# Patient Record
Sex: Female | Born: 1965 | Race: White | Hispanic: No | Marital: Married | Smoking: Never smoker
Health system: Southern US, Community
[De-identification: ages and names within clinical notes are randomized; demographics above are authoritative.]

## PROBLEM LIST (undated history)

## (undated) DIAGNOSIS — E785 Hyperlipidemia, unspecified: Secondary | ICD-10-CM

## (undated) DIAGNOSIS — N63 Unspecified lump in unspecified breast: Secondary | ICD-10-CM

## (undated) HISTORY — PX: APPENDECTOMY: SHX54

## (undated) HISTORY — PX: TONSILLECTOMY: SUR1361

## (undated) HISTORY — DX: Hyperlipidemia, unspecified: E78.5

## (undated) HISTORY — PX: BUNIONECTOMY: SHX129

---

## 1998-04-30 ENCOUNTER — Other Ambulatory Visit: Admission: RE | Admit: 1998-04-30 | Discharge: 1998-04-30 | Payer: Self-pay | Admitting: *Deleted

## 1999-04-22 ENCOUNTER — Other Ambulatory Visit: Admission: RE | Admit: 1999-04-22 | Discharge: 1999-04-22 | Payer: Self-pay | Admitting: *Deleted

## 2000-05-16 ENCOUNTER — Other Ambulatory Visit: Admission: RE | Admit: 2000-05-16 | Discharge: 2000-05-16 | Payer: Self-pay | Admitting: *Deleted

## 2002-12-27 ENCOUNTER — Other Ambulatory Visit: Admission: RE | Admit: 2002-12-27 | Discharge: 2002-12-27 | Payer: Self-pay | Admitting: Obstetrics and Gynecology

## 2007-02-02 ENCOUNTER — Encounter: Admission: RE | Admit: 2007-02-02 | Discharge: 2007-02-02 | Payer: Self-pay | Admitting: Obstetrics & Gynecology

## 2008-02-06 ENCOUNTER — Encounter: Admission: RE | Admit: 2008-02-06 | Discharge: 2008-02-06 | Payer: Self-pay | Admitting: Obstetrics & Gynecology

## 2011-02-08 ENCOUNTER — Other Ambulatory Visit (HOSPITAL_COMMUNITY)
Admission: RE | Admit: 2011-02-08 | Discharge: 2011-02-08 | Disposition: A | Discharge status: Home / Self Care | Payer: BC Managed Care – PPO | Source: Ambulatory Visit | Attending: Family Medicine | Admitting: Family Medicine

## 2011-02-08 ENCOUNTER — Other Ambulatory Visit: Payer: Self-pay | Admitting: Family Medicine

## 2011-02-08 DIAGNOSIS — Z1159 Encounter for screening for other viral diseases: Secondary | ICD-10-CM | POA: Insufficient documentation

## 2011-02-08 DIAGNOSIS — Z124 Encounter for screening for malignant neoplasm of cervix: Secondary | ICD-10-CM | POA: Insufficient documentation

## 2015-02-18 ENCOUNTER — Other Ambulatory Visit: Payer: Self-pay | Admitting: Obstetrics & Gynecology

## 2015-02-18 DIAGNOSIS — R928 Other abnormal and inconclusive findings on diagnostic imaging of breast: Secondary | ICD-10-CM

## 2015-02-21 ENCOUNTER — Other Ambulatory Visit: Payer: Self-pay | Admitting: Obstetrics & Gynecology

## 2015-02-21 ENCOUNTER — Ambulatory Visit
Admission: RE | Admit: 2015-02-21 | Discharge: 2015-02-21 | Disposition: A | Discharge status: Home / Self Care | Payer: Managed Care, Other (non HMO) | Source: Ambulatory Visit | Attending: Obstetrics & Gynecology | Admitting: Obstetrics & Gynecology

## 2015-02-21 DIAGNOSIS — R928 Other abnormal and inconclusive findings on diagnostic imaging of breast: Secondary | ICD-10-CM

## 2015-02-25 ENCOUNTER — Other Ambulatory Visit: Payer: Self-pay | Admitting: Obstetrics & Gynecology

## 2015-02-25 DIAGNOSIS — R928 Other abnormal and inconclusive findings on diagnostic imaging of breast: Secondary | ICD-10-CM

## 2015-02-28 ENCOUNTER — Ambulatory Visit
Admission: RE | Admit: 2015-02-28 | Discharge: 2015-02-28 | Disposition: A | Discharge status: Home / Self Care | Payer: Managed Care, Other (non HMO) | Source: Ambulatory Visit | Attending: Obstetrics & Gynecology | Admitting: Obstetrics & Gynecology

## 2015-02-28 DIAGNOSIS — R928 Other abnormal and inconclusive findings on diagnostic imaging of breast: Secondary | ICD-10-CM

## 2015-03-24 ENCOUNTER — Other Ambulatory Visit: Payer: Self-pay | Admitting: General Surgery

## 2015-03-24 DIAGNOSIS — N6310 Unspecified lump in the right breast, unspecified quadrant: Secondary | ICD-10-CM

## 2015-04-01 ENCOUNTER — Encounter (HOSPITAL_BASED_OUTPATIENT_CLINIC_OR_DEPARTMENT_OTHER): Payer: Self-pay | Admitting: *Deleted

## 2015-04-01 ENCOUNTER — Other Ambulatory Visit: Payer: Self-pay | Admitting: General Surgery

## 2015-04-01 ENCOUNTER — Other Ambulatory Visit: Payer: Self-pay

## 2015-04-01 DIAGNOSIS — N6310 Unspecified lump in the right breast, unspecified quadrant: Secondary | ICD-10-CM

## 2015-04-04 ENCOUNTER — Ambulatory Visit
Admission: RE | Admit: 2015-04-04 | Discharge: 2015-04-04 | Disposition: A | Discharge status: Home / Self Care | Payer: Managed Care, Other (non HMO) | Source: Ambulatory Visit | Attending: General Surgery | Admitting: General Surgery

## 2015-04-04 DIAGNOSIS — N6310 Unspecified lump in the right breast, unspecified quadrant: Secondary | ICD-10-CM

## 2015-04-05 NOTE — H&P (Signed)
Candice Newton  Location: [facility] Patient #: [id number] DOB: 03/14/1966 Married / Language: English / Race: White Female      History of Present Illness  The patient is a 50 year old female who presents with a breast mass. This is a pleasant 50 year old Caucasian female, referred by Dr. Jetta Lout at the breast center [facility] for evaluation and management of a right breast mass in the upper outer quadrant. Dr. Harlene Ramus is her PCP. Dr. Lonia Skinner is her gynecologist.  The patient states she has felt a small irregular lump in her right breast upper outer quadrant for a couple of years. She thinks has gotten bigger. She has never had a prior breast problems. Recent mammograms and ultrasound show a 1.6 cm irregular solid mass in the right breast at the 11:30 position, 4 cm from the nipple. There is also a 1.7 cm cyst in the right breast at the 1:30 position which looks benign. Image guided biopsy shows a complex sclerosing lesion. Differential diagnosis includes papilloma, hamartoma, radial scar. She was told by the images to have this area excised. She is in favor of that. Her husband is with her today.  Past history is fairly negative. She is healthy. No other complaints about her breast. No trauma. No nipple discharge. No skin changes.  Family history reveals that her mother died at age 4 of lung cancer. They thought this was metastatic. No surgical primary was searched for it due to advanced age.. No ovarian or pancreatic cancer in the family  After examining her I had a long talk with her and her husband. We discussed the differential diagnosis. We discussed the imaging findings of the pathology. I told her that at most, there was a 10% risk of in situ cancer. Most likely this is benign but there may be a risk factors involved and she very much in favor of having this area conservatively excised. She will be scheduled for right breast lumpectomy with  radioactive seed localization. I discussed the indications, details, techniques, and numerous risk of the surgery with her. She is aware of the risk of bleeding, infection, reoperation if this is cancer, nerve damage with chronic pain, cosmetic deformity, and other unforeseen problems. She understands these issues well. At this time all questions were answered. She agrees with this plan.   Other Problems  Hypercholesterolemia  Past Surgical History  Appendectomy Breast Biopsy Right. Foot Surgery Left. Oral Surgery Tonsillectomy  Diagnostic Studies History  Mammogram within last year  Allergies Penicillin V *PENICILLINS* Hives.  Medication History  Vitamin D (Ergocalciferol) (50000UNIT Capsule, Oral) Active. Medications Reconciled Allegra (30MG  Tablet, Oral) Active.  Social History  Alcohol use Moderate alcohol use. No drug use Tobacco use Never smoker.  Family History  Diabetes Mellitus Brother. Respiratory Condition Father, Mother. Thyroid problems Brother, Mother.  Pregnancy / Birth History  Contraceptive History Intrauterine device. Gravida 3 Irregular periods Maternal age 18-25 Para 3    Review of Systems  General Not Present- Appetite Loss, Chills, Fatigue, Fever, Night Sweats, Weight Gain and Weight Loss. Skin Not Present- Change in Wart/Mole, Dryness, Hives, Jaundice, New Lesions, Non-Healing Wounds, Rash and Ulcer. HEENT Present- Seasonal Allergies. Not Present- Earache, Hearing Loss, Hoarseness, Nose Bleed, Oral Ulcers, Ringing in the Ears, Sinus Pain, Sore Throat, Visual Disturbances, Wears glasses/contact lenses and Yellow Eyes. Respiratory Not Present- Bloody sputum, Chronic Cough, Difficulty Breathing, Snoring and Wheezing. Breast Present- Breast Mass. Not Present- Breast Pain, Nipple Discharge and Skin Changes. Cardiovascular Not Present- Chest  Pain, Difficulty Breathing Lying Down, Leg Cramps, Palpitations, Rapid Heart Rate,  Shortness of Breath and Swelling of Extremities. Gastrointestinal Present- Indigestion. Not Present- Abdominal Pain, Bloating, Bloody Stool, Change in Bowel Habits, Chronic diarrhea, Constipation, Difficulty Swallowing, Excessive gas, Gets full quickly at meals, Hemorrhoids, Nausea, Rectal Pain and Vomiting. Female Genitourinary Not Present- Frequency, Nocturia, Painful Urination, Pelvic Pain and Urgency. Musculoskeletal Present- Back Pain. Not Present- Joint Pain, Joint Stiffness, Muscle Pain, Muscle Weakness and Swelling of Extremities. Neurological Present- Numbness. Not Present- Decreased Memory, Fainting, Headaches, Seizures, Tingling, Tremor, Trouble walking and Weakness. Psychiatric Not Present- Anxiety, Bipolar, Change in Sleep Pattern, Depression, Fearful and Frequent crying. Endocrine Present- Hot flashes. Not Present- Cold Intolerance, Excessive Hunger, Hair Changes, Heat Intolerance and New Diabetes. Hematology Not Present- Easy Bruising, Excessive bleeding, Gland problems, HIV and Persistent Infections.  Vitals  Weight: 167 lb Height: 66in Body Surface Area: 1.85 m Body Mass Index: 26.95 kg/m  Temp.: 28F(Temporal)  Pulse: 100 (Regular)  BP: 142/80 (Sitting, Left Arm, Standard)       Physical Exam General Mental Status-Alert. General Appearance-Not in acute distress. Build & Nutrition-Well nourished. Posture-Normal posture. Gait-Normal.  Head and Neck Head-normocephalic, atraumatic with no lesions or palpable masses. Trachea-midline. Thyroid Gland Characteristics - normal size and consistency and no palpable nodules.  Chest and Lung Exam Chest and lung exam reveals -on auscultation, normal breath sounds, no adventitious sounds and normal vocal resonance.  Breast Note: Breasts are large. Skin is healthy. Nipple and areola complexes looked normal. No axillary adenopathy. At the 11 o'clock position of the right breast. About 4 cm  peripheral to the nipple at most, there is a 1.5 cm irregular palpable mass. A little bit vague but reproducible. No other masses in either breast. Nontender.   Cardiovascular Cardiovascular examination reveals -normal heart sounds, regular rate and rhythm with no murmurs and femoral artery auscultation bilaterally reveals normal pulses, no bruits, no thrills.  Abdomen Inspection Inspection of the abdomen reveals - No Hernias. Palpation/Percussion Palpation and Percussion of the abdomen reveal - Soft, Non Tender, No Rigidity (guarding), No hepatosplenomegaly and No Palpable abdominal masses.  Neurologic Neurologic evaluation reveals -alert and oriented x 3 with no impairment of recent or remote memory, normal attention span and ability to concentrate, normal sensation and normal coordination.  Musculoskeletal Normal Exam - Bilateral-Upper Extremity Strength Normal and Lower Extremity Strength Normal.    Assessment & Plan  BREAST MASS, RIGHT (N63) .   Your recent imaging studies and biopsy show a complex sclerosing lesion of the right breast, upper outer quadrant, 1.6 cm diameter. This is most likely a benign proliferative lesion such as a fibroadenoma or a papilloma but there is a small chance that there could be an in situ cancer. We have discussed the possibilities and probabilities. We have decided to proceed with right breast lumpectomy with radioactive seed localization. I have discussed the indications, technique, and risks of this surgery in detail. Please read the written information that we have printed out for you.    Edsel Petrin. Dalbert Batman, M.D., [facility] General and Minimally invasive Surgery Breast and Colorectal Surgery Office:   [phone] Pager:   [phone]

## 2015-04-07 ENCOUNTER — Encounter (HOSPITAL_BASED_OUTPATIENT_CLINIC_OR_DEPARTMENT_OTHER)
Admission: RE | Disposition: A | Discharge status: Home / Self Care | Payer: Self-pay | Source: Ambulatory Visit | Attending: General Surgery

## 2015-04-07 ENCOUNTER — Ambulatory Visit (HOSPITAL_BASED_OUTPATIENT_CLINIC_OR_DEPARTMENT_OTHER)
Admission: RE | Admit: 2015-04-07 | Discharge: 2015-04-07 | Disposition: A | Discharge status: Home / Self Care | Payer: Managed Care, Other (non HMO) | Source: Ambulatory Visit | Attending: General Surgery | Admitting: General Surgery

## 2015-04-07 ENCOUNTER — Ambulatory Visit (HOSPITAL_BASED_OUTPATIENT_CLINIC_OR_DEPARTMENT_OTHER): Payer: Managed Care, Other (non HMO) | Admitting: Certified Registered"

## 2015-04-07 ENCOUNTER — Encounter (HOSPITAL_BASED_OUTPATIENT_CLINIC_OR_DEPARTMENT_OTHER): Payer: Self-pay | Admitting: Certified Registered"

## 2015-04-07 ENCOUNTER — Ambulatory Visit
Admission: RE | Admit: 2015-04-07 | Discharge: 2015-04-07 | Disposition: A | Discharge status: Home / Self Care | Payer: Managed Care, Other (non HMO) | Source: Ambulatory Visit | Attending: General Surgery | Admitting: General Surgery

## 2015-04-07 DIAGNOSIS — E7800 Pure hypercholesterolemia, unspecified: Secondary | ICD-10-CM | POA: Insufficient documentation

## 2015-04-07 DIAGNOSIS — Z9889 Other specified postprocedural states: Secondary | ICD-10-CM

## 2015-04-07 DIAGNOSIS — N6021 Fibroadenosis of right breast: Secondary | ICD-10-CM | POA: Insufficient documentation

## 2015-04-07 DIAGNOSIS — N6310 Unspecified lump in the right breast, unspecified quadrant: Secondary | ICD-10-CM

## 2015-04-07 DIAGNOSIS — D241 Benign neoplasm of right breast: Secondary | ICD-10-CM | POA: Diagnosis not present

## 2015-04-07 DIAGNOSIS — N63 Unspecified lump in breast: Secondary | ICD-10-CM | POA: Diagnosis present

## 2015-04-07 DIAGNOSIS — Z801 Family history of malignant neoplasm of trachea, bronchus and lung: Secondary | ICD-10-CM | POA: Diagnosis not present

## 2015-04-07 HISTORY — DX: Other specified postprocedural states: Z98.890

## 2015-04-07 HISTORY — DX: Unspecified lump in unspecified breast: N63.0

## 2015-04-07 HISTORY — PX: BREAST LUMPECTOMY WITH RADIOACTIVE SEED LOCALIZATION: SHX6424

## 2015-04-07 SURGERY — BREAST LUMPECTOMY WITH RADIOACTIVE SEED LOCALIZATION
Anesthesia: General | Site: Breast | Laterality: Right

## 2015-04-07 MED ORDER — BUPIVACAINE-EPINEPHRINE (PF) 0.5% -1:200000 IJ SOLN
INTRAMUSCULAR | Status: DC | PRN
  Administered 2015-04-07: 10 mL via PERINEURAL

## 2015-04-07 MED ORDER — SODIUM CHLORIDE 0.9 % IJ SOLN: 3.0000 mL | INTRAMUSCULAR | Status: DC | PRN

## 2015-04-07 MED ORDER — ONDANSETRON HCL 4 MG/2ML IJ SOLN
INTRAMUSCULAR | Status: AC
  Filled 2015-04-07: qty 2

## 2015-04-07 MED ORDER — ONDANSETRON HCL 4 MG/2ML IJ SOLN
INTRAMUSCULAR | Status: DC | PRN
  Administered 2015-04-07: 4 mg via INTRAVENOUS

## 2015-04-07 MED ORDER — MIDAZOLAM HCL 2 MG/2ML IJ SOLN
1.0000 mg | INTRAMUSCULAR | Status: DC | PRN
  Administered 2015-04-07: 2 mg via INTRAVENOUS

## 2015-04-07 MED ORDER — SODIUM CHLORIDE 0.9 % IV SOLN: INTRAVENOUS | Status: DC

## 2015-04-07 MED ORDER — PROPOFOL 500 MG/50ML IV EMUL
INTRAVENOUS | Status: AC
  Filled 2015-04-07: qty 50

## 2015-04-07 MED ORDER — MIDAZOLAM HCL 2 MG/2ML IJ SOLN
INTRAMUSCULAR | Status: AC
  Filled 2015-04-07: qty 2

## 2015-04-07 MED ORDER — LIDOCAINE HCL (CARDIAC) 20 MG/ML IV SOLN
INTRAVENOUS | Status: DC | PRN
  Administered 2015-04-07: 60 mg via INTRAVENOUS

## 2015-04-07 MED ORDER — CEFAZOLIN SODIUM-DEXTROSE 2-3 GM-% IV SOLR
INTRAVENOUS | Status: AC
  Filled 2015-04-07: qty 50

## 2015-04-07 MED ORDER — CHLORHEXIDINE GLUCONATE 4 % EX LIQD: 1.0000 "application " | Freq: Once | CUTANEOUS | Status: DC

## 2015-04-07 MED ORDER — SODIUM CHLORIDE 0.9 % IV SOLN: 250.0000 mL | INTRAVENOUS | Status: DC | PRN

## 2015-04-07 MED ORDER — ACETAMINOPHEN 325 MG PO TABS: 650.0000 mg | ORAL_TABLET | ORAL | Status: DC | PRN

## 2015-04-07 MED ORDER — SCOPOLAMINE 1 MG/3DAYS TD PT72: 1.0000 | MEDICATED_PATCH | Freq: Once | TRANSDERMAL | Status: DC

## 2015-04-07 MED ORDER — LACTATED RINGERS IV SOLN
INTRAVENOUS | Status: DC
  Administered 2015-04-07 (×2): via INTRAVENOUS

## 2015-04-07 MED ORDER — MEPERIDINE HCL 25 MG/ML IJ SOLN: 6.2500 mg | INTRAMUSCULAR | Status: DC | PRN

## 2015-04-07 MED ORDER — OXYCODONE HCL 5 MG PO TABS: 5.0000 mg | ORAL_TABLET | Freq: Once | ORAL | Status: DC | PRN

## 2015-04-07 MED ORDER — DEXAMETHASONE SODIUM PHOSPHATE 4 MG/ML IJ SOLN
INTRAMUSCULAR | Status: DC | PRN
  Administered 2015-04-07: 10 mg via INTRAVENOUS

## 2015-04-07 MED ORDER — OXYCODONE HCL 5 MG/5ML PO SOLN: 5.0000 mg | Freq: Once | ORAL | Status: DC | PRN

## 2015-04-07 MED ORDER — DEXAMETHASONE SODIUM PHOSPHATE 10 MG/ML IJ SOLN
INTRAMUSCULAR | Status: AC
  Filled 2015-04-07: qty 1

## 2015-04-07 MED ORDER — LIDOCAINE HCL (CARDIAC) 20 MG/ML IV SOLN
INTRAVENOUS | Status: AC
  Filled 2015-04-07: qty 5

## 2015-04-07 MED ORDER — FENTANYL CITRATE (PF) 100 MCG/2ML IJ SOLN
INTRAMUSCULAR | Status: AC
  Filled 2015-04-07: qty 2

## 2015-04-07 MED ORDER — VANCOMYCIN HCL IN DEXTROSE 1-5 GM/200ML-% IV SOLN
INTRAVENOUS | Status: AC
  Filled 2015-04-07: qty 200

## 2015-04-07 MED ORDER — ACETAMINOPHEN 650 MG RE SUPP: 650.0000 mg | RECTAL | Status: DC | PRN

## 2015-04-07 MED ORDER — HYDROMORPHONE HCL 1 MG/ML IJ SOLN: 0.2500 mg | INTRAMUSCULAR | Status: DC | PRN

## 2015-04-07 MED ORDER — BUPIVACAINE-EPINEPHRINE (PF) 0.5% -1:200000 IJ SOLN
INTRAMUSCULAR | Status: AC
Start: 2015-04-07 — End: 2015-04-07
  Filled 2015-04-07: qty 30

## 2015-04-07 MED ORDER — FENTANYL CITRATE (PF) 100 MCG/2ML IJ SOLN
25.0000 ug | INTRAMUSCULAR | Status: DC | PRN
  Administered 2015-04-07: 50 ug via INTRAVENOUS

## 2015-04-07 MED ORDER — PROPOFOL 10 MG/ML IV BOLUS
INTRAVENOUS | Status: DC | PRN
  Administered 2015-04-07: 150 mg via INTRAVENOUS

## 2015-04-07 MED ORDER — SODIUM CHLORIDE 0.9 % IJ SOLN: 3.0000 mL | Freq: Two times a day (BID) | INTRAMUSCULAR | Status: DC

## 2015-04-07 MED ORDER — FENTANYL CITRATE (PF) 100 MCG/2ML IJ SOLN
50.0000 ug | INTRAMUSCULAR | Status: AC | PRN
  Administered 2015-04-07 (×3): 50 ug via INTRAVENOUS

## 2015-04-07 MED ORDER — CEFAZOLIN SODIUM-DEXTROSE 2-3 GM-% IV SOLR
2.0000 g | INTRAVENOUS | Status: AC
  Administered 2015-04-07: 2 g via INTRAVENOUS

## 2015-04-07 MED ORDER — OXYCODONE HCL 5 MG PO TABS
ORAL_TABLET | ORAL | Status: AC
  Filled 2015-04-07: qty 1

## 2015-04-07 MED ORDER — OXYCODONE HCL 5 MG PO TABS
5.0000 mg | ORAL_TABLET | ORAL | Status: DC | PRN
  Administered 2015-04-07: 5 mg via ORAL

## 2015-04-07 MED ORDER — GLYCOPYRROLATE 0.2 MG/ML IJ SOLN
0.2000 mg | Freq: Once | INTRAMUSCULAR | Status: DC | PRN
Start: 2015-04-07 — End: 2015-04-07

## 2015-04-07 MED ORDER — HYDROCODONE-ACETAMINOPHEN 5-325 MG PO TABS: 1.0000 | ORAL_TABLET | Freq: Four times a day (QID) | ORAL | Status: DC | PRN

## 2015-04-07 SURGICAL SUPPLY — 64 items
ADH SKN CLS APL DERMABOND .7 (GAUZE/BANDAGES/DRESSINGS) ×1
APL SKNCLS STERI-STRIP NONHPOA (GAUZE/BANDAGES/DRESSINGS)
APPLIER CLIP 9.375 MED OPEN (MISCELLANEOUS)
APR CLP MED 9.3 20 MLT OPN (MISCELLANEOUS)
BENZOIN TINCTURE PRP APPL 2/3 (GAUZE/BANDAGES/DRESSINGS) IMPLANT
BINDER BREAST LRG (GAUZE/BANDAGES/DRESSINGS) ×2 IMPLANT
BINDER BREAST MEDIUM (GAUZE/BANDAGES/DRESSINGS) IMPLANT
BINDER BREAST XLRG (GAUZE/BANDAGES/DRESSINGS) IMPLANT
BINDER BREAST XXLRG (GAUZE/BANDAGES/DRESSINGS) IMPLANT
BLADE HEX COATED 2.75 (ELECTRODE) ×3 IMPLANT
BLADE SURG 10 STRL SS (BLADE) IMPLANT
BLADE SURG 15 STRL LF DISP TIS (BLADE) ×1 IMPLANT
BLADE SURG 15 STRL SS (BLADE) ×3
CANISTER SUC SOCK COL 7IN (MISCELLANEOUS) IMPLANT
CANISTER SUCT 1200ML W/VALVE (MISCELLANEOUS) ×3 IMPLANT
CHLORAPREP W/TINT 26ML (MISCELLANEOUS) ×3 IMPLANT
CLIP APPLIE 9.375 MED OPEN (MISCELLANEOUS) IMPLANT
CLOSURE WOUND 1/2 X4 (GAUZE/BANDAGES/DRESSINGS)
COVER BACK TABLE 60X90IN (DRAPES) ×3 IMPLANT
COVER MAYO STAND STRL (DRAPES) ×3 IMPLANT
COVER PROBE W GEL 5X96 (DRAPES) ×3 IMPLANT
DECANTER SPIKE VIAL GLASS SM (MISCELLANEOUS) IMPLANT
DERMABOND ADVANCED (GAUZE/BANDAGES/DRESSINGS) ×2
DERMABOND ADVANCED .7 DNX12 (GAUZE/BANDAGES/DRESSINGS) ×1 IMPLANT
DEVICE DUBIN W/COMP PLATE 8390 (MISCELLANEOUS) ×3 IMPLANT
DRAPE LAPAROSCOPIC ABDOMINAL (DRAPES) ×3 IMPLANT
DRAPE UTILITY XL STRL (DRAPES) ×3 IMPLANT
DRSG PAD ABDOMINAL 8X10 ST (GAUZE/BANDAGES/DRESSINGS) IMPLANT
ELECT REM PT RETURN 9FT ADLT (ELECTROSURGICAL) ×3
ELECTRODE REM PT RTRN 9FT ADLT (ELECTROSURGICAL) ×1 IMPLANT
GLOVE BIO SURGEON STRL SZ7 (GLOVE) ×2 IMPLANT
GLOVE BIOGEL PI IND STRL 7.5 (GLOVE) IMPLANT
GLOVE BIOGEL PI INDICATOR 7.5 (GLOVE) ×4
GLOVE EUDERMIC 7 POWDERFREE (GLOVE) ×3 IMPLANT
GOWN STRL REUS W/ TWL LRG LVL3 (GOWN DISPOSABLE) ×1 IMPLANT
GOWN STRL REUS W/ TWL XL LVL3 (GOWN DISPOSABLE) ×1 IMPLANT
GOWN STRL REUS W/TWL LRG LVL3 (GOWN DISPOSABLE) ×3
GOWN STRL REUS W/TWL XL LVL3 (GOWN DISPOSABLE) ×3
KIT MARKER MARGIN INK (KITS) ×3 IMPLANT
NDL HYPO 25X1 1.5 SAFETY (NEEDLE) ×1 IMPLANT
NEEDLE HYPO 25X1 1.5 SAFETY (NEEDLE) ×3 IMPLANT
NS IRRIG 1000ML POUR BTL (IV SOLUTION) ×3 IMPLANT
PACK BASIN DAY SURGERY FS (CUSTOM PROCEDURE TRAY) ×3 IMPLANT
PENCIL BUTTON HOLSTER BLD 10FT (ELECTRODE) ×3 IMPLANT
SHEET MEDIUM DRAPE 40X70 STRL (DRAPES) IMPLANT
SLEEVE SCD COMPRESS KNEE MED (MISCELLANEOUS) ×3 IMPLANT
SPONGE GAUZE 4X4 12PLY STER LF (GAUZE/BANDAGES/DRESSINGS) IMPLANT
SPONGE LAP 18X18 X RAY DECT (DISPOSABLE) IMPLANT
SPONGE LAP 4X18 X RAY DECT (DISPOSABLE) ×3 IMPLANT
STRIP CLOSURE SKIN 1/2X4 (GAUZE/BANDAGES/DRESSINGS) IMPLANT
SUT ETHILON 3 0 FSL (SUTURE) IMPLANT
SUT MNCRL AB 4-0 PS2 18 (SUTURE) ×3 IMPLANT
SUT SILK 2 0 SH (SUTURE) ×3 IMPLANT
SUT VIC AB 2-0 CT1 27 (SUTURE)
SUT VIC AB 2-0 CT1 TAPERPNT 27 (SUTURE) IMPLANT
SUT VIC AB 3-0 SH 27 (SUTURE)
SUT VIC AB 3-0 SH 27X BRD (SUTURE) IMPLANT
SUT VICRYL 3-0 CR8 SH (SUTURE) ×3 IMPLANT
SYRINGE 10CC LL (SYRINGE) ×3 IMPLANT
TOWEL OR 17X24 6PK STRL BLUE (TOWEL DISPOSABLE) ×3 IMPLANT
TOWEL OR NON WOVEN STRL DISP B (DISPOSABLE) ×2 IMPLANT
TUBE CONNECTING 20'X1/4 (TUBING) ×1
TUBE CONNECTING 20X1/4 (TUBING) ×2 IMPLANT
YANKAUER SUCT BULB TIP NO VENT (SUCTIONS) ×3 IMPLANT

## 2015-04-07 NOTE — Anesthesia Procedure Notes (Signed)
Procedure Name: LMA Insertion Date/Time: 04/07/2015 7:25 AM Performed by: Alorah Mcree D Pre-anesthesia Checklist: Patient identified, Emergency Drugs available, Suction available and Patient being monitored Patient Re-evaluated:Patient Re-evaluated prior to inductionOxygen Delivery Method: Circle System Utilized Preoxygenation: Pre-oxygenation with 100% oxygen Intubation Type: IV induction Ventilation: Mask ventilation without difficulty LMA: LMA inserted LMA Size: 4.0 Number of attempts: 1 Airway Equipment and Method: Bite block Placement Confirmation: positive ETCO2 Tube secured with: Tape Dental Injury: Teeth and Oropharynx as per pre-operative assessment

## 2015-04-07 NOTE — Interval H&P Note (Signed)
History and Physical Interval Note:  04/07/2015 7:16 AM  Candice Newton  has presented today for surgery, with the diagnosis of right breast mass  The various methods of treatment have been discussed with the patient and family. After consideration of risks, benefits and other options for treatment, the patient has consented to  Procedure(s): RIGHT BREAST LUMPECTOMY WITH RADIOACTIVE SEED LOCALIZATION (Right) as a surgical intervention .  The patient's history has been reviewed, patient examined, no change in status, stable for surgery.  I have reviewed the patient's chart and labs.  Questions were answered to the patient's satisfaction.     Adin Hector

## 2015-04-07 NOTE — Discharge Instructions (Signed)
[facility] °Office Phone Number [phone] ° °BREAST BIOPSY/ PARTIAL MASTECTOMY: POST OP INSTRUCTIONS ° °Always review your discharge instruction sheet given to you by the facility where your surgery was performed. ° °IF YOU HAVE DISABILITY OR FAMILY LEAVE FORMS, YOU MUST BRING THEM TO THE OFFICE FOR PROCESSING.  DO NOT GIVE THEM TO YOUR DOCTOR. ° °1. A prescription for pain medication may be given to you upon discharge.  Take your pain medication as prescribed, if needed.  If narcotic pain medicine is not needed, then you may take acetaminophen (Tylenol) or ibuprofen (Advil) as needed. °2. Take your usually prescribed medications unless otherwise directed °3. If you need a refill on your pain medication, please contact your pharmacy.  They will contact our office to request authorization.  Prescriptions will not be filled after 5pm or on week-ends. °4. You should eat very light the first 24 hours after surgery, such as soup, crackers, pudding, etc.  Resume your normal diet the day after surgery. °5. Most patients will experience some swelling and bruising in the breast.  Ice packs and a good support bra will help.  Swelling and bruising can take several days to resolve.  °6. It is common to experience some constipation if taking pain medication after surgery.  Increasing fluid intake and taking a stool softener will usually help or prevent this problem from occurring.  A mild laxative (Milk of Magnesia or Miralax) should be taken according to package directions if there are no bowel movements after 48 hours. °7. Unless discharge instructions indicate otherwise, you may remove your bandages 24-48 hours after surgery, and you may shower at that time.  You may have steri-strips (small skin tapes) in place directly over the incision.  These strips should be left on the skin for 7-10 days.  If your surgeon used skin glue on the incision, you may shower in 24 hours.  The glue will flake off over the  next 2-3 weeks.  Any sutures or staples will be removed at the office during your follow-up visit. °8. ACTIVITIES:  You may resume regular daily activities (gradually increasing) beginning the next day.  Wearing a good support bra or sports bra minimizes pain and swelling.  You may have sexual intercourse when it is comfortable. °a. You may drive when you no longer are taking prescription pain medication, you can comfortably wear a seatbelt, and you can safely maneuver your car and apply brakes. °b. RETURN TO WORK:  ______________________________________________________________________________________ °9. You should see your doctor in the office for a follow-up appointment approximately two weeks after your surgery.  Your doctor’s nurse will typically make your follow-up appointment when she calls you with your pathology report.  Expect your pathology report 2-3 business days after your surgery.  You may call to check if you do not hear from us after three days. °10. OTHER INSTRUCTIONS: _______________________________________________________________________________________________ _____________________________________________________________________________________________________________________________________ °_____________________________________________________________________________________________________________________________________ °_____________________________________________________________________________________________________________________________________ ° °WHEN TO CALL YOUR DOCTOR: °1. Fever over 101.0 °2. Nausea and/or vomiting. °3. Extreme swelling or bruising. °4. Continued bleeding from incision. °5. Increased pain, redness, or drainage from the incision. ° °The clinic staff is available to answer your questions during regular business hours.  Please don’t hesitate to call and ask to speak to one of the nurses for clinical concerns.  If you have a medical emergency, go to the nearest  emergency room or call 911.  A surgeon from [facility] is always on call at the hospital. ° °For further questions, please visit [email or web address]  ° ° ° °  Post Anesthesia Home Care Instructions ° °Activity: °Get plenty of rest for the remainder of the day. A responsible adult should stay with you for 24 hours following the procedure.  °For the next 24 hours, DO NOT: °-Drive a car °-Operate machinery °-Drink alcoholic beverages °-Take any medication unless instructed by your physician °-Make any legal decisions or sign important papers. ° °Meals: °Start with liquid foods such as gelatin or soup. Progress to regular foods as tolerated. Avoid greasy, spicy, heavy foods. If nausea and/or vomiting occur, drink only clear liquids until the nausea and/or vomiting subsides. Call your physician if vomiting continues. ° °Special Instructions/Symptoms: °Your throat may feel dry or sore from the anesthesia or the breathing tube placed in your throat during surgery. If this causes discomfort, gargle with warm salt water. The discomfort should disappear within 24 hours. ° °If you had a scopolamine patch placed behind your ear for the management of post- operative nausea and/or vomiting: ° °1. The medication in the patch is effective for 72 hours, after which it should be removed.  Wrap patch in a tissue and discard in the trash. Wash hands thoroughly with soap and water. °2. You may remove the patch earlier than 72 hours if you experience unpleasant side effects which may include dry mouth, dizziness or visual disturbances. °3. Avoid touching the patch. Wash your hands with soap and water after contact with the patch. °  ° °

## 2015-04-07 NOTE — Op Note (Signed)
Patient Name:           Candice Newton   Date of Surgery:        04/07/2015  Pre op Diagnosis:      Abnormal mammogram and right breast mass, upper outer quadrant  Post op Diagnosis:    Same  Procedure:                 Right breast lumpectomy with radioactive seed localization and margin assessment  Surgeon:                     Edsel Petrin. Dalbert Batman, M.D., FACS  Assistant:                      Or staff  Operative Indications:    This is a pleasant 50 year old Caucasian female, referred by Dr. Jetta Lout at the breast center [facility] for evaluation and management of a right breast mass in the upper outer quadrant. Dr. Harlene Ramus is her PCP. Dr. Lonia Skinner is her gynecologist.      The patient states she has felt a small irregular lump in her right breast upper outer quadrant for a couple of years. She thinks has gotten bigger. She has never had a prior breast problems. Recent mammograms and ultrasound show a 1.6 cm irregular solid mass in the right breast at the 11:30 position, 4 cm from the nipple. There is also a 1.7 cm cyst in the right breast at the 1:30 position which looks benign. Image guided biopsy shows a complex sclerosing lesion. Differential diagnosis includes papilloma, hamartoma, radial scar. She was told by the images to have this area excised. She is in favor of that.     Past history is fairly negative. She is healthy. No other complaints about her breast. No trauma. No nipple discharge. No skin changes.      Family history reveals that her mother died at age 35 of lung cancer. They thought this was metastatic. No surgical primary was searched for it due to advanced age.. No ovarian or pancreatic cancer in the family     She will be scheduled for right breast lumpectomy with radioactive seed localization.   Operative Findings:       The marker clip and radioactive seed were identified in the upper outer quadrant of the right breast both preoperatively with the  neoprobe and postoperatively with Faxitron image control.  The radiologist concurred that the area in question had been removed.  The radioactive seed was confirmed by the pathologist.  Procedure in Detail:          Following the induction of general LMA anesthesia the patient's right breast was prepped and draped in sterile fashion.  Intravenous antibiotics were given.  Surgical timeout was performed.  0.5% Marcaine with epinephrine was used as a local infiltration anesthetic.  Using the neoprobe I identified the area of maximum radioactive counts in the upper outer quadrant and performed  a curvilinear circumareolar incision in this position.  Dissection was carried down into the breast tissue.  Using the neoprobe as a guide a dissected around the area of radioactivity.  The specimen was marked with silk sutures and a 6 color ink kit to orient the pathologist.  Specimen mammogram looked good as described above.  The specimen was sent to the lab.  Hemostasis was excellent and achieved with electrocautery.  The wound was irrigated with saline.  The breast tissues were closed with interrupted sutures  of 3-0 Vicryl and the skin closed with a running subcuticular 4-0 Monocryl and Dermabond.  Breast binder was placed the patient taken to PACU in stable condition.  EBL 10 mL.  Counts correct.  Complications none.      Edsel Petrin. Dalbert Batman, M.D., FACS General and Minimally Invasive Surgery Breast and Colorectal Surgery  04/07/2015 8:13 AM

## 2015-04-07 NOTE — Anesthesia Preprocedure Evaluation (Addendum)

## 2015-04-07 NOTE — Anesthesia Postprocedure Evaluation (Signed)
Anesthesia Post Note  Patient: Candice Newton  Procedure(s) Performed: Procedure(s) (LRB): RIGHT BREAST LUMPECTOMY WITH RADIOACTIVE SEED LOCALIZATION (Right)  Patient location during evaluation: PACU Anesthesia Type: General Level of consciousness: awake and alert Pain management: pain level controlled Vital Signs Assessment: post-procedure vital signs reviewed and stable Respiratory status: spontaneous breathing, nonlabored ventilation and respiratory function stable Cardiovascular status: blood pressure returned to baseline and stable Postop Assessment: no signs of nausea or vomiting Anesthetic complications: no    Last Vitals:  Filed Vitals:   04/07/15 0845 04/07/15 0900  BP: 119/93 119/78  Pulse: 82 72  Temp:    Resp: 14 15    Last Pain:  Filed Vitals:   04/07/15 0905  PainSc: 4                  Talaya Lamprecht A

## 2015-04-07 NOTE — Transfer of Care (Signed)
Immediate Anesthesia Transfer of Care Note  Patient: Candice Newton  Procedure(s) Performed: Procedure(s): RIGHT BREAST LUMPECTOMY WITH RADIOACTIVE SEED LOCALIZATION (Right)  Patient Location: PACU  Anesthesia Type:General  Level of Consciousness: awake and patient cooperative  Airway & Oxygen Therapy: Patient Spontanous Breathing and Patient connected to face mask oxygen  Post-op Assessment: Report given to RN and Post -op Vital signs reviewed and stable  Post vital signs: Reviewed and stable  Last Vitals:  Filed Vitals:   04/07/15 0638  BP: 141/90  Pulse: 90  Temp: 36.7 C  Resp: 20    Complications: No apparent anesthesia complications

## 2015-04-08 ENCOUNTER — Encounter (HOSPITAL_BASED_OUTPATIENT_CLINIC_OR_DEPARTMENT_OTHER): Payer: Self-pay | Admitting: General Surgery

## 2015-04-08 NOTE — Progress Notes (Signed)
Quick Note:  Inform patient of Pathology report,.tell her that her breast biopsy is benign. 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 This is good news. I will discuss this with her in detail in the office visit.   hmi ______

## 2015-04-08 NOTE — Addendum Note (Signed)
Addendum  created 04/08/15 1015 by Tawni Millers, CRNA   Modules edited: Charges VN

## 2016-05-10 ENCOUNTER — Other Ambulatory Visit: Payer: Self-pay | Admitting: Obstetrics & Gynecology

## 2016-05-10 DIAGNOSIS — Z1231 Encounter for screening mammogram for malignant neoplasm of breast: Secondary | ICD-10-CM

## 2016-06-02 ENCOUNTER — Ambulatory Visit: Payer: Managed Care, Other (non HMO)

## 2016-07-01 ENCOUNTER — Other Ambulatory Visit: Payer: Self-pay | Admitting: Obstetrics & Gynecology

## 2016-07-01 ENCOUNTER — Ambulatory Visit
Admission: RE | Admit: 2016-07-01 | Discharge: 2016-07-01 | Disposition: A | Discharge status: Home / Self Care | Payer: Managed Care, Other (non HMO) | Source: Ambulatory Visit | Attending: Obstetrics & Gynecology | Admitting: Obstetrics & Gynecology

## 2016-07-01 DIAGNOSIS — Z1231 Encounter for screening mammogram for malignant neoplasm of breast: Secondary | ICD-10-CM

## 2016-09-06 ENCOUNTER — Encounter: Payer: Self-pay | Admitting: Family Medicine

## 2016-09-06 ENCOUNTER — Ambulatory Visit (INDEPENDENT_AMBULATORY_CARE_PROVIDER_SITE_OTHER): Payer: 59 | Admitting: Family Medicine

## 2016-09-06 VITALS — BP 129/85 | HR 86 | Ht 66.0 in | Wt 175.8 lb

## 2016-09-06 DIAGNOSIS — O24410 Gestational diabetes mellitus in pregnancy, diet controlled: Secondary | ICD-10-CM

## 2016-09-06 DIAGNOSIS — J3089 Other allergic rhinitis: Secondary | ICD-10-CM

## 2016-09-06 DIAGNOSIS — Z9889 Other specified postprocedural states: Secondary | ICD-10-CM | POA: Insufficient documentation

## 2016-09-06 DIAGNOSIS — N6310 Unspecified lump in the right breast, unspecified quadrant: Secondary | ICD-10-CM

## 2016-09-06 DIAGNOSIS — O24419 Gestational diabetes mellitus in pregnancy, unspecified control: Secondary | ICD-10-CM | POA: Insufficient documentation

## 2016-09-06 DIAGNOSIS — Z8342 Family history of familial hypercholesterolemia: Secondary | ICD-10-CM | POA: Insufficient documentation

## 2016-09-06 DIAGNOSIS — E559 Vitamin D deficiency, unspecified: Secondary | ICD-10-CM

## 2016-09-06 DIAGNOSIS — Z833 Family history of diabetes mellitus: Secondary | ICD-10-CM | POA: Insufficient documentation

## 2016-09-06 DIAGNOSIS — E782 Mixed hyperlipidemia: Secondary | ICD-10-CM | POA: Diagnosis not present

## 2016-09-06 DIAGNOSIS — Z801 Family history of malignant neoplasm of trachea, bronchus and lung: Secondary | ICD-10-CM | POA: Insufficient documentation

## 2016-09-06 DIAGNOSIS — J302 Other seasonal allergic rhinitis: Secondary | ICD-10-CM | POA: Insufficient documentation

## 2016-09-06 DIAGNOSIS — Z789 Other specified health status: Secondary | ICD-10-CM | POA: Insufficient documentation

## 2016-09-06 DIAGNOSIS — E663 Overweight: Secondary | ICD-10-CM | POA: Insufficient documentation

## 2016-09-06 HISTORY — DX: Gestational diabetes mellitus in pregnancy, unspecified control: O24.419

## 2016-09-06 HISTORY — DX: Other specified postprocedural states: Z98.890

## 2016-09-06 NOTE — Progress Notes (Signed)
New patient office visit note:  Impression and Recommendations:    1. Overweight (BMI 25.0-29.9)   2. Diet controlled gestational diabetes mellitus (GDM) in first trimester Active  3. Mixed hyperlipidemia- HA's from meds   4. h/o Vitamin D deficiency   5. Seasonal allergic rhinitis due to other allergic trigger   6. Family history of diabetes mellitus in brother   46. Family history of high cholesterol- brother   8. Family history of lung cancer- parents   38. Takes Multiple dietary supplements/ appetite suppressants    10. Breast mass, right; lumpectomy 2017   11. History of bunionectomy of left great toe   12. History of lumpectomy of right breast - benign     Overweight (BMI 25.0-29.9) Extensive discussion with patient about lose it app to track everything   F/up 4 wks to examine diet/ trends of eating- good and bad  - pt admits to eating out a lot and being on the road a lot with work- often having drinks with dinner.     h/o Gestational diabetes-  with all 3 children We will check A1c near future.   - pt would like to schedule a complete physical exam with fasting blood work.  h/o Vitamin D deficiency Patient takes vitamin D over-the-counter medicine daily.  She used to be on 50,000 units weekly.  We will recheck vitamin D levels at next bld draw  Takes Multiple dietary supplements/ appetite suppressants  Patient brought in multiple supplements that she is taking to help her lose weight.  These include medications with green tea extract, caffeine etc.  - advised patient if she feels a supplements help her and she can afford them, that is okay to continue them but that these will not make her lose weight.  Wt loss is 85% diet driven and 05% exercise driven.    Family history of high cholesterol- brother Will obtain fasting lipid profile in future  Family history of lung cancer- parents due to smoking Pt has never smoked in her life  Family history of diabetes  mellitus in brother With personal history gestational diabetes and family history-we will obtain A1c during future blood draw.   advised wt loss to goal of under BMI 25  Mixed hyperlipidemia- HA's from meds Obtain FLP with fasting blood work in near future.  Discussed with patient low saturated and Transfats diet.  Breast mass, right; lumpectomy 2017 Asked patient to get me records from her surgeon and her mammogram records.  Seasonal allergies Patient occasionally takes Zyrtec or Allegra.  Usually worse in spring and fall.  Recommend she do sinus rinses twice daily during allergy season. Milta Deiters Med sinus rinses or AYR recommended)   The patient was counseled, risk factors were discussed, anticipatory guidance given.   New Prescriptions   No medications on file     Discontinued Medications   HYDROCODONE-ACETAMINOPHEN (NORCO) 5-325 MG TABLET    Take 1-2 tablets by mouth every 6 (six) hours as needed for moderate pain or severe pain.   VITAMIN D, ERGOCALCIFEROL, (DRISDOL) 50000 UNITS CAPS CAPSULE    Take 50,000 Units by mouth every 7 (seven) days.      No orders of the defined types were placed in this encounter.    Gross side effects, risk and benefits, and alternatives of medications discussed with patient.  Patient is aware that all medications have potential side effects and we are unable to predict every side effect or drug-drug interaction that may  occur.  Expresses verbal understanding and consents to current therapy plan and treatment regimen.  Return in about 4 weeks (around 10/04/2016) for lose it app and come fasting for bldwrk. .  Please see AVS handed out to patient at the end of our visit for further patient instructions/ counseling done pertaining to today's office visit.    Note: This document was prepared using Dragon voice recognition software and may include unintentional dictation  errors.  ----------------------------------------------------------------------------------------------------------------------    Subjective:    Chief complaint:   Chief Complaint  Patient presents with  . Establish Care     HPI: Candice Newton is a pleasant 51 y.o. female who presents to [place] [facility] today to review their medical history with me and establish care.   I asked the patient to review their chronic problem list with me to ensure everything was updated and accurate.    All recent office visits with other providers, any medical records that patient brought in etc  - I reviewed today.    Also asked pt to get me medical records from [facility] providers/ specialists that they had seen within the past 3-5 years- if they are in private practice and/or do not work for a [employer], [facility], [facility], [place] or [employer] owned practice.  Told them to call their specialists to clarify this if they are not sure.   Prior PCP-->  Dr [facility].  Also seen PA's there.   Patient is most concerned today with her being overweight and would like to get on a good health plan to help prevent disease.  She is worried that she is unable to lose weight although she endorses a lot of travel for work in which she eats out a lot and drinks alcohol nightly when on the road.      Overweight:   She does either bike, walk or swim 30 minutes 4 days a week now- which is relatively new.  She does have an IUD in place and goes to a gynecologist. - She does not recall the name of her provider  Patient has never smoked, drinks approximately 2-12 drinks per week depending on her social situation.  History of lung cancer in both parents, they both smoked.  Her brother has high cholesterol and diabetes.     Problem  h/o Gestational diabetes-  with all 3 children   Patient had history of gestational diabetes with her first child when she was younger.  It  was so bad she was almost close to having to be on insulin.  The episodes became less severe with each pregnancy.  Never needed meds--> pt only ate chicken and green beans   Mixed hyperlipidemia- HA's from meds   Currently patient with history of hyperlipidemia.  She feels that specks.  She states she had headache secondary to the cholesterol meds her provider put her on.    Uncertain of 10 yr risk score.  Eats out at restaurants at least 50% of the time.   Family History of Diabetes Mellitus in Brother   Patient history of gestational diabetes as well in all 3 pregnancies.   Overweight (Bmi 25.0-29.9)   Not happy with being overweight and she is worried about getting diabetes.  Denies h/o elevated sugars in past lab work but not sure they checked A1c.   Breast mass, right; lumpectomy 2017  h/o Vitamin D deficiency  Seasonal Allergies  Family history of high cholesterol-  brother  Family history of lung cancer- parents due to smoking  Takes Multiple dietary supplements/ appetite suppressants   History of Bunionectomy of Left Great Toe  History of lumpectomy of right breast - benign      Wt Readings from Last 3 Encounters:  09/06/16 175 lb 12.8 oz (79.7 kg)  04/07/15 163 lb (73.9 kg)   BP Readings from Last 3 Encounters:  09/06/16 129/85  04/07/15 129/76   Pulse Readings from Last 3 Encounters:  09/06/16 86  04/07/15 70   BMI Readings from Last 3 Encounters:  09/06/16 28.37 kg/m  04/07/15 26.31 kg/m    Patient Care Team    Relationship Specialty Notifications Start End  Mellody Dance, DO PCP - General Family Medicine  09/06/16   Rosemary Holms, [facility] Consulting Physician Podiatry  09/06/16   Princess Bruins, MD Consulting Physician Obstetrics and Gynecology  09/06/16     Patient Active Problem List   Diagnosis Date Noted  . h/o Gestational diabetes-  with all 3 children 09/06/2016    Priority: High  . Mixed hyperlipidemia- HA's from meds 09/06/2016     Priority: High  . Family history of diabetes mellitus in brother 09/06/2016    Priority: High  . Overweight (BMI 25.0-29.9) 09/06/2016    Priority: Medium  . Breast mass, right; lumpectomy 2017 04/07/2015    Priority: Medium  . h/o Vitamin D deficiency 09/06/2016    Priority: Low  . Seasonal allergies 09/06/2016    Priority: Low  . Family history of high cholesterol- brother 09/06/2016  . Family history of lung cancer- parents due to smoking 09/06/2016  . Takes Multiple dietary supplements/ appetite suppressants  09/06/2016  . History of bunionectomy of left great toe 09/06/2016  . History of lumpectomy of right breast - benign 09/06/2016     Past Medical History:  Diagnosis Date  . Breast mass    right  . Hyperlipidemia      Past Medical History:  Diagnosis Date  . Breast mass    right  . Hyperlipidemia      Past Surgical History:  Procedure Laterality Date  . APPENDECTOMY    . BREAST LUMPECTOMY WITH RADIOACTIVE SEED LOCALIZATION Right 04/07/2015   Procedure: RIGHT BREAST LUMPECTOMY WITH RADIOACTIVE SEED LOCALIZATION;  Surgeon: Fanny Skates, MD;  Location: [facility];  Service: General;  Laterality: Right;  . BUNIONECTOMY Left   . TONSILLECTOMY       Family History  Problem Relation Age of Onset  . Cancer Mother        lung  . Cancer Father        lung  . Diabetes Sister   . Hyperlipidemia Sister      History  Drug Use No     History  Alcohol Use  . Yes    Comment: social     History  Smoking Status  . Never Smoker  Smokeless Tobacco  . Never Used     Outpatient Encounter Prescriptions as of 09/06/2016  Medication Sig  . cetirizine (ZYRTEC) 10 MG tablet Take 10 mg by mouth daily.  . cholecalciferol (VITAMIN D) 1000 units tablet Take 1,000 Units by mouth daily.  . fexofenadine (ALLEGRA) 180 MG tablet Take 180 mg by mouth daily.  . Multiple Vitamin (MULTIVITAMIN) tablet Take 1 tablet by mouth daily.  Marland Kitchen pyridOXINE (VITAMIN  B-6) 100 MG tablet Take 100 mg by mouth daily.  . [DISCONTINUED] HYDROcodone-acetaminophen (NORCO) 5-325 MG tablet Take 1-2 tablets by mouth every 6 (  six) hours as needed for moderate pain or severe pain.  . [DISCONTINUED] Vitamin D, Ergocalciferol, (DRISDOL) 50000 units CAPS capsule Take 50,000 Units by mouth every 7 (seven) days.   No facility-administered encounter medications on file as of 09/06/2016.     Allergies: Penicillins   Review of Systems  Constitutional: Negative for chills, diaphoresis, fever, malaise/fatigue and weight loss.  HENT: Negative for congestion, sore throat and tinnitus.   Eyes: Negative for blurred vision, double vision and photophobia.  Respiratory: Negative for cough and wheezing.   Cardiovascular: Negative for chest pain and palpitations.  Gastrointestinal: Negative for blood in stool, diarrhea, nausea and vomiting.  Genitourinary: Negative for dysuria, frequency and urgency.  Musculoskeletal: Negative for joint pain and myalgias.  Skin: Negative for itching and rash.  Neurological: Negative for dizziness, focal weakness, weakness and headaches.  Endo/Heme/Allergies: Negative for environmental allergies and polydipsia. Does not bruise/bleed easily.  Psychiatric/Behavioral: Negative for depression and memory loss. The patient is not nervous/anxious and does not have insomnia.      Objective:   Blood pressure 129/85, pulse 86, height 5\' 6"  (1.676 m), weight 175 lb 12.8 oz (79.7 kg). Body mass index is 28.37 kg/m. General: Well Developed, well nourished, and in no acute distress.  Neuro: Alert and oriented x3, extra-ocular muscles intact, sensation grossly intact.  HEENT:Young/AT, PERRLA, neck supple, No carotid bruits Skin: no gross rashes  Cardiac: Regular rate and rhythm Respiratory: Essentially clear to auscultation bilaterally. Not using accessory muscles, speaking in full sentences.  Abdominal: not grossly distended Musculoskeletal: Ambulates w/o  diff, FROM * 4 ext.  Vasc: less 2 sec cap RF, warm and pink  Psych:  No HI/SI, judgement and insight good, Euthymic mood. Full Affect.    No results found for this or any previous visit (from the past 2160 hour(s)).

## 2016-09-06 NOTE — Assessment & Plan Note (Signed)
Patient brought in multiple supplements that she is taking to help her lose weight.  These include medications with green tea extract, caffeine etc.  - advised patient if she feels a supplements help her and she can afford them, that is okay to continue them but that these will not make her lose weight.  Wt loss is 85% diet driven and 32% exercise driven.

## 2016-09-06 NOTE — Assessment & Plan Note (Signed)
Patient occasionally takes Zyrtec or Allegra.  Usually worse in spring and fall.  Recommend she do sinus rinses twice daily during allergy season. Milta Deiters Med sinus rinses or AYR recommended)

## 2016-09-06 NOTE — Assessment & Plan Note (Signed)
With personal history gestational diabetes and family history-we will obtain A1c during future blood draw.   advised wt loss to goal of under BMI 25

## 2016-09-06 NOTE — Assessment & Plan Note (Addendum)
Obtain FLP with fasting blood work in near future.  Discussed with patient low saturated and Transfats diet.

## 2016-09-06 NOTE — Assessment & Plan Note (Signed)
Will obtain fasting lipid profile in future

## 2016-09-06 NOTE — Assessment & Plan Note (Signed)
Patient takes vitamin D over-the-counter medicine daily.  She used to be on 50,000 units weekly.  We will recheck vitamin D levels at next bld draw

## 2016-09-06 NOTE — Assessment & Plan Note (Addendum)
Extensive discussion with patient about lose it app to track everything   F/up 4 wks to examine diet/ trends of eating- good and bad  - pt admits to eating out a lot and being on the road a lot with work- often having drinks with dinner.

## 2016-09-06 NOTE — Assessment & Plan Note (Signed)
>>  ASSESSMENT AND PLAN FOR BREAST MASS, RIGHT; LUMPECTOMY 2017 WRITTEN ON 09/06/2016  5:14 PM BY OPALSKI, DEBORAH, DO  Asked patient to get me records from her surgeon and her mammogram records.

## 2016-09-06 NOTE — Assessment & Plan Note (Addendum)
We will check A1c near future.   - pt would like to schedule a complete physical exam with fasting blood work.

## 2016-09-06 NOTE — Assessment & Plan Note (Signed)
Asked patient to get me records from her surgeon and her mammogram records.

## 2016-09-06 NOTE — Assessment & Plan Note (Signed)
Pt has never smoked in her life

## 2016-09-06 NOTE — Patient Instructions (Addendum)
Lose it app.  Track everything you put in your mouth.  Even including drinks.  Your goal should be 2  pound weight loss per week.  Goals for the next 4 weeks is to just track everything in the lose it app and just be honest about what you eat and don't eat.  Dec 2016- last full set of bldwrk- we will see what was done for you then and might need to add different labs in near future once I review results   [facility] sinus rinses or AYR- do it twice daily      Behavior Modification Ideas for Weight Management  Weight management involves adopting a healthy lifestyle that includes a knowledge of nutrition and exercise, a positive attitude and the right kind of motivation. Internal motives such as better health, increased energy, self-esteem and personal control increase your chances of lifelong weight management success.  Remember to have realistic goals and think long-term success. Believe in yourself and you can do it. The following information will give you ideas to help you meet your goals.  Control Your Home Environment  Eat only while sitting down at the kitchen or dining room table. Do not eat while watching television, reading, cooking, talking on the phone, standing at the refrigerator or working on the computer. Keep tempting foods out of the house - don't buy them. Keep tempting foods out of sight. Have low-calorie foods ready to eat. Unless you are preparing a meal, stay out of the kitchen. Have healthy snacks at your disposal, such as small pieces of fruit, vegetables, canned fruit, pretzels, low-fat string cheese and nonfat cottage cheese.  Control Your Work Environment  Do not eat at [employer] or keep tempting snacks at your desk. If you get hungry between meals, plan healthy snacks and bring them with you to work. During your breaks, go for a walk instead of eating. If you work around food, plan in advance the one item you will eat at mealtime. Make it inconvenient to nibble  on food by chewing gum, sugarless candy or drinking water or another low-calorie beverage. Do not work through meals. Skipping meals slows down metabolism and may result in overeating at the next meal. If food is available for special occasions, either pick the healthiest item, nibble on low-fat snacks brought from home, don't have anything offered, choose one option and have a small amount, or have only a beverage.  Control Your Mealtime Environment  Serve your plate of food at the stove or kitchen counter. Do not put the serving dishes on the table. If you do put dishes on the table, remove them immediately when finished eating. Fill half of your plate with vegetables, a quarter with lean protein and a quarter with starch. Use smaller plates, bowls and glasses. A smaller portion will look large when it is in a little dish. Politely refuse second helpings. When fixing your plate, limit portions of food to one scoop/serving or less.   Daily Food Management  Replace eating with another activity that you will not associate with food. Wait 20 minutes before eating something you are craving. Drink a large glass of water or diet soda before eating. Always have a big glass or bottle of water to drink throughout the day. Avoid high-calorie add-ons such as cream with your coffee, butter, mayonnaise and salad dressings.  Shopping: Do not shop when hungry or tired. Shop from a list and avoid buying anything that is not on your list. If  you must have tempting foods, buy individual-sized packages and try to find a lower-calorie alternative. Don't taste test in the store. Read food labels. Compare products to help you make the healthiest choices.  Preparation: Chew a piece of gum while cooking meals. Use a quarter teaspoon if you taste test your food. Try to only fix what you are going to eat, leaving yourself no chance for seconds. If you have prepared more food than you need, portion it into  individual containers and freeze or refrigerate immediately. Don't snack while cooking meals.  Eating: Eat slowly. Remember it takes about 20 minutes for your stomach to send a message to your brain that it is full. Don't let fake hunger make you think you need more. The ideal way to eat is to take a bite, put your utensil down, take a sip of water, cut your next bite, take a bit, put your utensil down and so on. Do not cut your food all at one time. Cut only as needed. Take small bites and chew your food well. Stop eating for a minute or two at least once during a meal or snack. Take breaks to reflect and have conversation.  Cleanup and Leftovers: Label leftovers for a specific meal or snack. Freeze or refrigerate individual portions of leftovers. Do not clean up if you are still hungry.  Eating Out and Social Eating  Do not arrive hungry. Eat something light before the meal. Try to fill up on low-calorie foods, such as vegetables and fruit, and eat smaller portions of the high-calorie foods. Eat foods that you like, but choose small portions. If you want seconds, wait at least 20 minutes after you have eaten to see if you are actually hungry or if your eyes are bigger than your stomach. Limit alcoholic beverages. Try a soda water with a twist of lime. Do not skip other meals in the day to save room for the special event.  At Restaurants: Order  la carte rather than buffet style. Order some vegetables or a salad for an appetizer instead of eating bread. If you order a high-calorie dish, share it with someone. Try an after-dinner mint with your coffee. If you do have dessert, share it with two or more people. Don't overeat because you do not want to waste food. Ask for a doggie bag to take extra food home. Tell the server to put half of your entree in a to go bag before the meal is served to you. Ask for salad dressing, gravy or high-fat sauces on the side. Dip the tip of your fork in  the dressing before each bite. If bread is served, ask for only one piece. Try it plain without butter or oil. At [employer] where oil and vinegar is served with bread, use only a small amount of oil and a lot of vinegar for dipping.  At a Friend[facility] House: Offer to bring a dish, appetizer or dessert that is low in calories. Serve yourself small portions or tell the host that you only want a small amount. Stand or sit away from the snack table. Stay away from the kitchen or stay busy if you are near the food. Limit your alcohol intake.  At [employer] and Cafeterias: Cover most of your plate with lettuce and/or vegetables. Use a salad plate instead of a dinner plate. After eating, clear away your dishes before having coffee or tea.  Entertaining at Home: Explore low-fat, low-cholesterol cookbooks. Use single-serving foods like chicken breasts  or hamburger patties. Prepare low-calorie appetizers and desserts.   Holidays: Keep tempting foods out of sight. Decorate the house without using food. Have low-calorie beverages and foods on hand for guests. Allow yourself one planned treat a day. Don't skip meals to save up for the holiday feast. Eat regular, planned meals.   Exercise Well  Make exercise a priority and a planned activity in the day. If possible, walk the entire or part of the distance to work. Get an exercise buddy. Go for a walk with a colleague during one of your breaks, go to the gym, run or take a walk with a friend, walk in the mall with a shopping companion. Park at the end of the parking lot and walk to the store or office entrance. Always take the stairs all of the way or at least part of the way to your floor. If you have a desk job, walk around the office frequently. Do leg lifts while sitting at your desk. Do something outside on the weekends like going for a hike or a bike ride.   Have a Healthy Attitude  Make health your weight management priority. Be  realistic. Have a goal to achieve a healthier you, not necessarily the lowest weight or ideal weight based on calculations or tables. Focus on a healthy eating style, not on dieting. Dieting usually lasts for a short amount of time and rarely produces long-term success. Think long term. You are developing new healthy behaviors to follow next month, in a year and in a decade.    This information is for educational purposes only and is not intended to replace the advice of your doctor or health care provider. We encourage you to discuss with your doctor any questions or concerns you may have.        Guidelines for Losing Weight   We want weight loss that will last so you should lose 1-2 pounds a week.  THAT IS IT! Please pick THREE things a month to change. Once it is a habit check off the item. Then pick another three items off the list to become habits.  If you are already doing a habit on the list GREAT!  Cross that item off!  Don't drink your calories. Ie, alcohol, soda, fruit juice, and sweet tea.   Drink more water. Drink a glass when you feel hungry or before each meal.   Eat breakfast - Complex carb and protein (likeDannon light and fit yogurt, oatmeal, fruit, eggs, [place] bacon).  Measure your cereal.  Eat no more than one cup a day. (ie Kashi)  Eat an apple a day.  Add a vegetable a day.  Try a new vegetable a month.  Use Pam! Stop using oil or butter to cook.  Don't finish your plate or use smaller plates.  Share your dessert.  Eat sugar free Jello for dessert or frozen grapes.  Don't eat 2-3 hours before bed.  Switch to whole wheat bread, pasta, and brown rice.  Make healthier choices when you eat out. No fries!  Pick baked chicken, NOT fried.  Don't forget to SLOW DOWN when you eat. It is not going anywhere.   Take the stairs.  Park far away in the parking lot  Lift soup cans (or weights) for 10 minutes while watching TV.  Walk at work for 10 minutes  during break.  Walk outside 1 time a week with your friend, kids, dog, or significant other.  Start a walking group at  church.  Walk the mall as much as you can tolerate.   Keep a food diary.  Weigh yourself daily.  Walk for 15 minutes 3 days per week.  Cook at home more often and eat out less. If life happens and you go back to old habits, it is okay.  Just start over. You can do it!  If you experience chest pain, get short of breath, or tired during the exercise, please stop immediately and inform your doctor.    Before you even begin to attack a weight-loss plan, it pays to remember this: You are not fat. You have fat. Losing weight isn't about blame or shame; it's simply another achievement to accomplish. Dieting is like any other skill-you have to buckle down and work at it. As long as you act in a smart, reasonable way, you'll ultimately get where you want to be. Here are some weight loss pearls for you.   1. It's Not a Diet. It's a Lifestyle Thinking of a diet as something you're on and suffering through only for the short term doesn't work. To shed weight and keep it off, you need to make permanent changes to the way you eat. It's OK to indulge occasionally, of course, but if you cut calories temporarily and then revert to your old way of eating, you'll gain back the weight quicker than you can say yo-yo. Use it to lose it. Research shows that one of the best predictors of long-term weight loss is how many pounds you drop in the first month. For that reason, nutritionists often suggest being stricter for the first two weeks of your new eating strategy to build momentum. Cut out added sugar and alcohol and avoid unrefined carbs. After that, figure out how you can reincorporate them in a way that's healthy and maintainable.  2. There's a Right Way to Exercise Working out burns calories and fat and boosts your metabolism by building muscle. But those trying to lose weight are notorious  for overestimating the number of calories they burn and underestimating the amount they take in. Unfortunately, your system is biologically programmed to hold on to extra pounds and that means when you start exercising, your body senses the deficit and ramps up its hunger signals. If you're not diligent, you'll eat everything you burn and then some. Use it, to lose it. Cardio gets all the exercise glory, but strength and interval training are the real heroes. They help you build lean muscle, which in turn increases your metabolism and calorie-burning ability 3. Don't Overreact to Mild Hunger Some people have a hard time losing weight because of hunger anxiety. To them, being hungry is bad-something to be avoided at all costs-so they carry snacks with them and eat when they don't need to. Others eat because they're stressed out or bored. While you never want to get to the point of being ravenous (that's when bingeing is likely to happen), a hunger pang, a craving, or the fact that it's 3:00 p.m. should not send you racing for the vending machine or obsessing about the energy bar in your purse. Ideally, you should put off eating until your stomach is growling and it's difficult to concentrate.  Use it to lose it. When you feel the urge to eat, use the HALT method. Ask yourself, Am I really hungry? Or am I angry or anxious, lonely or bored, or tired? If you're still not certain, try the apple test. If you're truly hungry, an apple should  seem delicious; if it doesn't, something else is going on. Or you can try drinking water and making yourself busy, if you are still hungry try a healthy snack.  4. Not All Calories Are Created Equal The mechanics of weight loss are pretty simple: Take in fewer calories than you use for energy. But the kind of food you eat makes all the difference. Processed food that's high in saturated fat and refined starch or sugar can cause inflammation that disrupts the hormone signals that  tell your brain you're full. The result: You eat a lot more.  Use it to lose it. Clean up your diet. Swap in whole, unprocessed foods, including vegetables, lean protein, and healthy fats that will fill you up and give you the biggest nutritional bang for your calorie buck. In a few weeks, as your brain starts receiving regular hunger and fullness signals once again, you'll notice that you feel less hungry overall and naturally start cutting back on the amount you eat.  5. Protein, Produce, and Plant-Based Fats Are Your Weight-Loss Trinity Here's why eating the three Ps regularly will help you drop pounds. Protein fills you up. You need it to build lean muscle, which keeps your metabolism humming so that you can torch more fat. People in a weight-loss program who ate double the recommended daily allowance for protein (about 110 grams for a 150-pound woman) lost 70 percent of their weight from fat, while people who ate the RDA lost only about 40 percent, one study found. Produce is packed with filling fiber. "It's very difficult to consume too many calories if you're eating a lot of vegetables. Example: Three cups of broccoli is a lot of food, yet only 93 calories. (Fruit is another story. It can be easy to overeat and can contain a lot of calories from sugar, so be sure to monitor your intake.) Plant-based fats like olive oil and those in avocados and nuts are healthy and extra satiating.  Use it to lose it. Aim to incorporate each of the three Ps into every meal and snack. People who eat protein throughout the day are able to keep weight off, according to a study in the [place] of Clinical Nutrition. In addition to meat, poultry and seafood, good sources are beans, lentils, eggs, tofu, and yogurt. As for fat, keep portion sizes in check by measuring out salad dressing, oil, and nut butters (shoot for one to two tablespoons). Finally, eat veggies or a little fruit at every meal. People who did that  consumed 308 fewer calories but didn't feel any hungrier than when they didn't eat more produce.  7. How You Eat Is As Important As What You Eat In order for your brain to register that you're full, you need to focus on what you're eating. Sit down whenever you eat, preferably at a table. Turn off the TV or computer, put down your phone, and look at your food. Smell it. Chew slowly, and don't put another bite on your fork until you swallow. When women ate lunch this attentively, they consumed 30 percent less when snacking later than those who listened to an audiobook at lunchtime, according to a study in the [place] of Nutrition. 8. Weighing Yourself Really Works The scale provides the best evidence about whether your efforts are paying off. Seeing the numbers tick up or down or stagnate is motivation to keep going-or to rethink your approach. A 2015 study at [facility] found that daily weigh-ins helped people  lose more weight, keep it off, and maintain that loss, even after two years. Use it to lose it. Step on the scale at the same time every day for the best results. If your weight shoots up several pounds from one weigh-in to the next, don't freak out. Eating a lot of salt the night before or having your period is the likely culprit. The number should return to normal in a day or two. It's a steady climb that you need to do something about. 9. Too Much Stress and Too Little Sleep Are Your Enemies When you're tired and frazzled, your body cranks up the production of cortisol, the stress hormone that can cause carb cravings. Not getting enough sleep also boosts your levels of ghrelin, a hormone associated with hunger, while suppressing leptin, a hormone that signals fullness and satiety. People on a diet who slept only five and a half hours a night for two weeks lost 55 percent less fat and were hungrier than those who slept eight and a half hours, according to a study in the [place]. Use it to lose it. Prioritize sleep, aiming for seven hours or more a night, which research shows helps lower stress. And make sure you're getting quality zzz's. If a snoring spouse or a fidgety cat wakes you up frequently throughout the night, you may end up getting the equivalent of just four hours of sleep, according to a study from [facility]. Keep pets out of the bedroom, and use a white-noise app to drown out snoring. 10. You Will Hit a plateau-And You Can Bust Through It As you slim down, your body releases much less leptin, the fullness hormone.  If you're not strength training, start right now. Building muscle can raise your metabolism to help you overcome a plateau. To keep your body challenged and burning calories, incorporate new moves and more intense intervals into your workouts or add another sweat session to your weekly routine. Alternatively, cut an extra 100 calories or so a day from your diet. Now that you've lost weight, your body simply doesn't need as much fuel.    Since food equals calories, in order to lose weight you must either eat fewer calories, exercise more to burn off calories with activity, or both. Food that is not used to fuel the body is stored as fat. A major component of losing weight is to make smarter food choices. Here's how:  1)   Limit non-nutritious foods, such as: Sugar, honey, syrups and candy Pastries, donuts, pies, cakes and cookies Soft drinks, sweetened juices and alcoholic beverages  2)  Cut down on high-fat foods by: - Choosing poultry, fish or lean red meat - Choosing low-fat cooking methods, such as baking, broiling, steaming, grilling and boiling - Using low-fat or non-fat dairy products - Using vinaigrette, herbs, lemon or fat-free salad dressings - Avoiding fatty meats, such as bacon, sausage, franks, ribs and luncheon meats - Avoiding high-fat snacks like nuts, chips and chocolate - Avoiding fried  foods - Using less butter, margarine, oil and mayonnaise - Avoiding high-fat gravies, cream sauces and cream-based soups  3) Eat a variety of foods, including: - Fruit and vegetables that are raw, steamed or baked - Whole grains, breads, cereal, rice and pasta - Dairy products, such as low-fat or non-fat milk or yogurt, low-fat cottage cheese and low-fat cheese - Protein-rich foods like chicken, [place], fish, lean meat and legumes, or beans  4) Change your eating habits  by: - Eat three balanced meals a day to help control your hunger - Watch portion sizes and eat small servings of a variety of foods - Choose low-calorie snacks - Eat only when you are hungry and stop when you are satisfied - Eat slowly and try not to perform other tasks while eating - Find other activities to distract you from food, such as walking, taking up a hobby or being involved in the community - Include regular exercise in your daily routine ( minimum of 20 min of moderate-intensity exercise at least 5 days/week)  - Find a support group, if necessary, for emotional support in your weight loss journey           Easy ways to cut 100 calories   1. Eat your eggs with hot sauce OR salsa instead of cheese.  Eggs are great for breakfast, but many people consider eggs and cheese to be BFFs. Instead of cheese-1 oz. of cheddar has 114 calories-top your eggs with hot sauce, which contains no calories and helps with satiety and metabolism. Salsa is also a great option!!  2. Top your toast, waffles or pancakes with fresh berries instead of jelly or syrup. Half a cup of berries-fresh, frozen or thawed-has about 40 calories, compared with 2 tbsp. of maple syrup or jelly, which both have about 100 calories. The berries will also give you a good punch of fiber, which helps keep you full and satisfied and won't spike blood sugar quickly like the jelly or syrup. 3. Swap the non-fat latte for black coffee with a splash of  half-and-half. Contrary to its name, that non-fat latte has 130 calories and a startling 19g of carbohydrates per 16 oz. serving. Replacing that 'light' drinkable dessert with a black coffee with a splash of half-and-half saves you more than 100 calories per 16 oz. serving. 4. Sprinkle salads with freeze-dried raspberries instead of dried cranberries. If you want a sweet addition to your nutritious salad, stay away from dried cranberries. They have a whopping 130 calories per  cup and 30g carbohydrates. Instead, sprinkle freeze-dried raspberries guilt-free and save more than 100 calories per  cup serving, adding 3g of belly-filling fiber. 5. Go for mustard in place of mayo on your sandwich. Mustard can add really nice flavor to any sandwich, and there are tons of varieties, from spicy to honey. A serving of mayo is 95 calories, versus 10 calories in a serving of mustard.  Or try an avocado mayo spread: You can find the recipe few click this link: https://www.californiaavocado.com/recipes/recipe-container/[place]-avocado-mayo 6. Choose a DIY salad dressing instead of the store-bought kind. Mix Dijon or whole grain mustard with low-fat Kefir or red wine vinegar and garlic. 7. Use hummus as a spread instead of a dip. Use hummus as a spread on a high-fiber cracker or tortilla with a sandwich and save on calories without sacrificing taste. 8. Pick just one salad "accessory." Salad isn't automatically a calorie winner. It's easy to over-accessorize with toppings. Instead of topping your salad with nuts, avocado and cranberries (all three will clock in at 313 calories), just pick one. The next day, choose a different accessory, which will also keep your salad interesting. You don't wear all your jewelry every day, right? 9. Ditch the white pasta in favor of spaghetti squash. One cup of cooked spaghetti squash has about 40 calories, compared with traditional spaghetti, which comes with more than 200.  Spaghetti squash is also nutrient-dense. It's a good source of fiber and Vitamins  A and C, and it can be eaten just like you would eat pasta-with a great tomato sauce and [place] meatballs or with pesto, tofu and spinach, for example. 10. Dress up your chili, soups and stews with non-fat [place] yogurt instead of sour cream. Just a 'dollop' of sour cream can set you back 115 calories and a whopping 12g of fat-seven of which are of the artery-clogging variety. Added bonus: [place] yogurt is packed with muscle-building protein, calcium and B Vitamins. 11. Mash cauliflower instead of mashed potatoes. One cup of traditional mashed potatoes-in all their creamy goodness-has more than 200 calories, compared to mashed cauliflower, which you can typically eat for less than 100 calories per 1 cup serving. Cauliflower is a great source of the antioxidant indole-3-carbinol (I3C), which may help reduce the risk of some cancers, like breast cancer. 12. Ditch the ice cream sundae in favor of a [place] yogurt parfait. Instead of a cup of ice cream or fro-yo for dessert, try 1 cup of nonfat Greek yogurt topped with fresh berries and a sprinkle of cacao nibs. Both toppings are packed with antioxidants, which can help reduce cellular inflammation and oxidative damage. And the comparison is a no-brainer: One cup of ice cream has about 275 calories; one cup of frozen yogurt has about 230; and a cup of Greek yogurt has just 130, plus twice the protein, so you're less likely to return to the freezer for a second helping. 13. Put olive oil in a spray container instead of using it directly from the bottle. Each tablespoon of olive oil is 120 calories and 15g of fat. Use a mister instead of pouring it straight into the pan or onto a salad. This allows for portion control and will save you more than 100 calories. 14. When baking, substitute canned pumpkin for butter or oil. Canned pumpkin-not pumpkin pie mix-is loaded with Vitamin A, which  is important for skin and eye health, as well as immunity. And the comparisons are pretty crazy:  cup of canned pumpkin has about 40 calories, compared to butter or oil, which has more than 800 calories. Yes, 800 calories. Applesauce and mashed banana can also serve as good substitutions for butter or oil, usually in a 1:1 ratio. 15. Top casseroles with high-fiber cereal instead of breadcrumbs. Breadcrumbs are typically made with white bread, while breakfast cereals contain 5-9g of fiber per serving. Not only will you save more than 150 calories per  cup serving, the swap will also keep you more full and you'll get a metabolism boost from the added fiber. 16. Snack on pistachios instead of macadamia nuts. Believe it or not, you get the same amount of calories from 35 pistachios (100 calories) as you would from only five macadamia nuts. 17. Chow down on kale chips rather than potato chips. This is my favorite 'don't knock it 'till you try it' swap. Kale chips are so easy to make at home, and you can spice them up with a little grated parmesan or chili powder. Plus, they're a mere fraction of the calories of potato chips, but with the same crunch factor we crave so often. 18. Add seltzer and some fruit slices to your cocktail instead of soda or fruit juice. One cup of soda or fruit juice can pack on as much as 140 calories. Instead, use seltzer and fruit slices. The fruit provides valuable phytochemicals, such as flavonoids and anthocyanins, which help to combat cancer and stave off the aging process.

## 2016-10-04 ENCOUNTER — Ambulatory Visit: Payer: Managed Care, Other (non HMO) | Admitting: Family Medicine

## 2016-10-27 ENCOUNTER — Ambulatory Visit: Payer: Managed Care, Other (non HMO) | Admitting: Family Medicine

## 2016-11-03 ENCOUNTER — Encounter: Payer: Self-pay | Admitting: Family Medicine

## 2016-11-03 ENCOUNTER — Ambulatory Visit (INDEPENDENT_AMBULATORY_CARE_PROVIDER_SITE_OTHER): Payer: 59 | Admitting: Family Medicine

## 2016-11-03 VITALS — BP 130/84 | HR 88 | Ht 66.0 in | Wt 171.8 lb

## 2016-11-03 DIAGNOSIS — Z833 Family history of diabetes mellitus: Secondary | ICD-10-CM

## 2016-11-03 DIAGNOSIS — O24410 Gestational diabetes mellitus in pregnancy, diet controlled: Secondary | ICD-10-CM

## 2016-11-03 DIAGNOSIS — E559 Vitamin D deficiency, unspecified: Secondary | ICD-10-CM

## 2016-11-03 DIAGNOSIS — Z801 Family history of malignant neoplasm of trachea, bronchus and lung: Secondary | ICD-10-CM

## 2016-11-03 DIAGNOSIS — Z789 Other specified health status: Secondary | ICD-10-CM

## 2016-11-03 DIAGNOSIS — Z0000 Encounter for general adult medical examination without abnormal findings: Secondary | ICD-10-CM

## 2016-11-03 DIAGNOSIS — Z8349 Family history of other endocrine, nutritional and metabolic diseases: Secondary | ICD-10-CM

## 2016-11-03 DIAGNOSIS — E782 Mixed hyperlipidemia: Secondary | ICD-10-CM

## 2016-11-03 DIAGNOSIS — E663 Overweight: Secondary | ICD-10-CM

## 2016-11-03 DIAGNOSIS — Z1211 Encounter for screening for malignant neoplasm of colon: Secondary | ICD-10-CM

## 2016-11-03 NOTE — Progress Notes (Signed)
Impression and Recommendations:    1. Encounter for wellness examination   2. Screening for colon cancer   3. Diet controlled gestational diabetes mellitus (GDM) in first trimester   4. Family history of diabetes mellitus in brother   44. Mixed hyperlipidemia- HA's from meds   6. Overweight (BMI 25.0-29.9)   7. h/o Vitamin D deficiency   8. Family history of lung cancer- parents due to smoking   9. Takes Multiple dietary supplements/ appetite suppressants      Please see orders section below for further details of actions taken during this office visit.  Gross side effects, risk and benefits, and alternatives of medications discussed with patient.  Patient is aware that all medications have potential side effects and we are unable to predict every side effect or drug-drug interaction that may occur.  Expresses verbal understanding and consents to current therapy plan and treatment regiment.  1) Anticipatory Guidance: Discussed importance of wearing a seatbelt while driving, not texting while driving; sunscreen when outside along with yearly skin surveillance; eating a well balanced and modest diet; physical activity at least 25 minutes per day or 150 min/ week of moderate to intense activity.  2) Immunizations / Screenings / Labs:  All immunizations and screenings that patient agrees to, are up-to-date per recommendations or will be updated today.  Patient understands the needs for q 49mo dental and yearly vision screens which pt will schedule independently. Obtain CBC, CMP, HgA1c, Lipid panel, TSH and vit D when fasting if not already done recently.   3) Weight:   Discussed goal of losing even 5-10% of current body weight which would improve overall feelings of well being and improve objective health data significantly.   Improve nutrient density of diet through increasing intake of fruits and vegetables and decreasing saturated/trans fats, white flour products and refined sugar  products.   F-up preventative CPE in 1 year. F/up sooner for chronic care management as discussed and/or prn.   No problem-specific Assessment & Plan notes found for this encounter.    Orders Placed This Encounter  Procedures  . Ambulatory referral to Gastroenterology     No orders of the defined types were placed in this encounter.    Discontinued Medications   FEXOFENADINE (ALLEGRA) 180 MG TABLET    Take 180 mg by mouth daily.      Please see orders placed and AVS handed out to patient at the end of our visit for further patient instructions/ counseling done pertaining to today's office visit.     Subjective:    Chief Complaint  Patient presents with  . Annual Exam   CC:  cpe  HPI: Candice Newton is a 51 y.o. female who presents to [place] at [facility] today a yearly health maintenance exam.  Health Maintenance Summary Reviewed and updated, unless pt declines services.  Tdap: Up to date Tobacco History Reviewed:   No, never Alcohol:    No concerns, no excessive use Exercise Habits:   Not meeting AHA guidelines STD concerns:   none Drug Use:   None Birth control method:   n/a Menses regular:    Yes,  has IUD in place.    1 sexual partner.   Always normal PAPs Lumps or breast concerns:      None-   Every April- her birth month each year or so Breast Cancer Family History:      None   Health Maintenance  Topic Date Due  .  INFLUENZA VACCINE  10/13/2016  . PAP SMEAR  03/14/2017 (Originally 02/07/2014)  . COLONOSCOPY  09/06/2017 (Originally 06/25/2016)  . HIV Screening  09/06/2028 (Originally [birth year]/25/1981)  . MAMMOGRAM  07/02/2018  . TETANUS/TDAP  08/07/2018     Wt Readings from Last 3 Encounters:  11/03/16 171 lb 12.8 oz (77.9 kg)  09/06/16 175 lb 12.8 oz (79.7 kg)  04/07/15 163 lb (73.9 kg)   BP Readings from Last 3 Encounters:  11/03/16 130/84  09/06/16 129/85  04/07/15 129/76   Pulse Readings from Last 3 Encounters:  11/03/16 88   09/06/16 86  04/07/15 70     Past Medical History:  Diagnosis Date  . Breast mass    right  . Hyperlipidemia       Past Surgical History:  Procedure Laterality Date  . APPENDECTOMY    . BREAST LUMPECTOMY WITH RADIOACTIVE SEED LOCALIZATION Right 04/07/2015   Procedure: RIGHT BREAST LUMPECTOMY WITH RADIOACTIVE SEED LOCALIZATION;  Surgeon: Fanny Skates, MD;  Location: [facility];  Service: General;  Laterality: Right;  . BUNIONECTOMY Left   . TONSILLECTOMY        Family History  Problem Relation Age of Onset  . Cancer Mother        lung  . Cancer Father        lung  . Diabetes Sister   . Hyperlipidemia Sister       History  Drug Use No  ,   History  Alcohol Use  . Yes    Comment: social  ,   History  Smoking Status  . Never Smoker  Smokeless Tobacco  . Never Used  ,   History  Sexual Activity  . Sexual activity: Yes  . Birth control/ protection: IUD    Current Outpatient Prescriptions on File Prior to Visit  Medication Sig Dispense Refill  . cetirizine (ZYRTEC) 10 MG tablet Take 10 mg by mouth daily.    . cholecalciferol (VITAMIN D) 1000 units tablet Take 1,000 Units by mouth daily.    . Multiple Vitamin (MULTIVITAMIN) tablet Take 1 tablet by mouth daily.    Marland Kitchen pyridOXINE (VITAMIN B-6) 100 MG tablet Take 100 mg by mouth daily.     No current facility-administered medications on file prior to visit.     Allergies: Penicillins  Review of Systems: General:   Denies fever, chills, unexplained weight loss.  Optho/Auditory:   Denies visual changes, blurred vision/LOV Respiratory:   Denies SOB, DOE more than baseline levels.  Cardiovascular:   Denies chest pain, palpitations, new onset peripheral edema  Gastrointestinal:   Denies nausea, vomiting, diarrhea.  Genitourinary: Denies dysuria, freq/ urgency, flank pain or discharge from genitals.  Endocrine:     Denies hot or cold intolerance, polyuria, polydipsia. Musculoskeletal:    Denies unexplained myalgias, joint swelling, unexplained arthralgias, gait problems.  Skin:  Denies rash, suspicious lesions Neurological:     Denies dizziness, unexplained weakness, numbness  Psychiatric/Behavioral:   Denies mood changes, suicidal or homicidal ideations, hallucinations    Objective:    Blood pressure 130/84, pulse 88, height 5\' 6"  (1.676 m), weight 171 lb 12.8 oz (77.9 kg), last menstrual period 10/13/2016. Body mass index is 27.73 kg/m. General Appearance:    Alert, cooperative, no distress, appears stated age  Head:    Normocephalic, without obvious abnormality, atraumatic  Eyes:    PERRL, conjunctiva/corneas clear, EOM's intact, fundi    benign, both eyes  Ears:    Normal TM's and external ear canals, both ears  Nose:   Nares normal, septum midline, mucosa normal, no drainage    or sinus tenderness  Throat:   Lips w/o lesion, mucosa moist, and tongue normal; teeth and   gums normal  Neck:   Supple, symmetrical, trachea midline, no adenopathy;    thyroid:  no enlargement/tenderness/nodules; no carotid   bruit or JVD  Back:     Symmetric, no curvature, ROM normal, no CVA tenderness  Lungs:     Clear to auscultation bilaterally, respirations unlabored, no       Wh/ R/ R  Chest Wall:    No tenderness or gross deformity; normal excursion   Heart:    Regular rate and rhythm, S1 and S2 normal, no murmur, rub   or gallop  Breast Exam:   N/a   Abdomen:     Soft, non-tender, bowel sounds active all four quadrants, NO   G/R/R, no masses, no organomegaly  Genitalia:   defer  Rectal:   defer  Extremities:   Extremities normal, atraumatic, no cyanosis or gross edema  Pulses:   2+ and symmetric all extremities  Skin:   Warm, dry, Skin color, texture, turgor normal, no obvious rashes or lesions Psych: No HI/SI, judgement and insight good, Euthymic mood. Full Affect.  Neurologic:   CNII-XII intact, normal strength, sensation and reflexes    Throughout   DEXA:     [email or web address]   Based on the [place] FRAX tool, a 50 year old white woman with no other risk factors has a 9.3% 10-year risk for any osteoporotic fracture. White women between the ages of 46 and 88 years with equivalent or greater 10-year fracture risks based on specific risk factors include but are not limited to the following persons: 60) a 50 year old current smoker with a BMI less than 21 kg/m2, daily alcohol use, and parental fracture history; 21) a 49 year old woman with a parental fracture history; 9) a 50 year old woman with a BMI less than 21 kg/m2 and daily alcohol use; and 76) a 50 year old current smoker with daily alcohol use.   The FRAX tool also predicts 10-year fracture risks for black, Asian, and Hispanic women in the [place]. In general, estimated fracture risks in nonwhite women are lower than those for white women of the same age. Although the USPSTF recommends using a 9.3% 10-year fracture risk threshold to screen women aged 33 to 87 years,

## 2016-11-03 NOTE — Patient Instructions (Addendum)
Please have last pap results sent to the office so we can update you chart, thank you.     Preventive Care for Adults, Female  A healthy lifestyle and preventive care can promote health and wellness. Preventive health guidelines for women include the following key practices.   A routine yearly physical is a good way to check with your health care provider about your health and preventive screening. It is a chance to share any concerns and updates on your health and to receive a thorough exam.   Visit your dentist for a routine exam and preventive care every 6 months. Brush your teeth twice a day and floss once a day. Good oral hygiene prevents tooth decay and gum disease.   The frequency of eye exams is based on your age, health, family medical history, use of contact lenses, and other factors. Follow your health care provider's recommendations for frequency of eye exams.   Eat a healthy diet. Foods like vegetables, fruits, whole grains, low-fat dairy products, and lean protein foods contain the nutrients you need without too many calories. Decrease your intake of foods high in solid fats, added sugars, and salt. Eat the right amount of calories for you.Get information about a proper diet from your health care provider, if necessary.   Regular physical exercise is one of the most important things you can do for your health. Most adults should get at least 150 minutes of moderate-intensity exercise (any activity that increases your heart rate and causes you to sweat) each week. In addition, most adults need muscle-strengthening exercises on 2 or more days a week.   Maintain a healthy weight. The body mass index (BMI) is a screening tool to identify possible weight problems. It provides an estimate of body fat based on height and weight. Your health care provider can find your BMI, and can help you achieve or maintain a healthy weight.For adults 20 years and older:   - A BMI below 18.5 is  considered underweight.   - A BMI of 18.5 to 24.9 is normal.   - A BMI of 25 to 29.9 is considered overweight.   - A BMI of 30 and above is considered obese.   Maintain normal blood lipids and cholesterol levels by exercising and minimizing your intake of trans and saturated fats.  Eat a balanced diet with plenty of fruit and vegetables. Blood tests for lipids and cholesterol should begin at age 62 and be repeated every 5 years minimum.  If your lipid or cholesterol levels are high, you are over 40, or you are at high risk for heart disease, you may need your cholesterol levels checked more frequently.Ongoing high lipid and cholesterol levels should be treated with medicines if diet and exercise are not working.   If you smoke, find out from your health care provider how to quit. If you do not use tobacco, do not start.   Lung cancer screening is recommended for adults aged 7-80 years who are at high risk for developing lung cancer because of a history of smoking. A yearly low-dose CT scan of the lungs is recommended for people who have at least a 30-pack-year history of smoking and are a current smoker or have quit within the past 15 years. A pack year of smoking is smoking an average of 1 pack of cigarettes a day for 1 year (for example: 1 pack a day for 30 years or 2 packs a day for 15 years). Yearly screening should continue  until the smoker has stopped smoking for at least 15 years. Yearly screening should be stopped for people who develop a health problem that would prevent them from having lung cancer treatment.   If you are pregnant, do not drink alcohol. If you are breastfeeding, be very cautious about drinking alcohol. If you are not pregnant and choose to drink alcohol, do not have more than 1 drink per day. One drink is considered to be 12 ounces (355 mL) of beer, 5 ounces (148 mL) of wine, or 1.5 ounces (44 mL) of liquor.   Avoid use of street drugs. Do not share needles with anyone.  Ask for help if you need support or instructions about stopping the use of drugs.   High blood pressure causes heart disease and increases the risk of stroke. Your blood pressure should be checked at least yearly.  Ongoing high blood pressure should be treated with medicines if weight loss and exercise do not work.   If you are 22-89 years old, ask your health care provider if you should take aspirin to prevent strokes.   Diabetes screening involves taking a blood sample to check your fasting blood sugar level. This should be done once every 3 years, after age 71, if you are within normal weight and without risk factors for diabetes. Testing should be considered at a younger age or be carried out more frequently if you are overweight and have at least 1 risk factor for diabetes.   Breast cancer screening is essential preventive care for women. You should practice "breast self-awareness."  This means understanding the normal appearance and feel of your breasts and may include breast self-examination.  Any changes detected, no matter how small, should be reported to a health care provider.  Women in their 27s and 30s should have a clinical breast exam (CBE) by a health care provider as part of a regular health exam every 1 to 3 years.  After age 78, women should have a CBE every year.  Starting at age 72, women should consider having a mammogram (breast X-ray test) every year.  Women who have a family history of breast cancer should talk to their health care provider about genetic screening.  Women at a high risk of breast cancer should talk to their health care providers about having an MRI and a mammogram every year.   -Breast cancer gene (BRCA)-related cancer risk assessment is recommended for women who have family members with BRCA-related cancers. BRCA-related cancers include breast, ovarian, tubal, and peritoneal cancers. Having family members with these cancers may be associated with an increased  risk for harmful changes (mutations) in the breast cancer genes BRCA1 and BRCA2. Results of the assessment will determine the need for genetic counseling and BRCA1 and BRCA2 testing.   The Pap test is a screening test for cervical cancer. A Pap test can show cell changes on the cervix that might become cervical cancer if left untreated. A Pap test is a procedure in which cells are obtained and examined from the lower end of the uterus (cervix).   - Women should have a Pap test starting at age 43.   - Between ages 33 and 1, Pap tests should be repeated every 2 years.   - Beginning at age 3, you should have a Pap test every 3 years as long as the past 3 Pap tests have been normal.   - Some women have medical problems that increase the chance of getting cervical cancer. Talk  to your health care provider about these problems. It is especially important to talk to your health care provider if a new problem develops soon after your last Pap test. In these cases, your health care provider may recommend more frequent screening and Pap tests.   - The above recommendations are the same for women who have or have not gotten the vaccine for human papillomavirus (HPV).   - If you had a hysterectomy for a problem that was not cancer or a condition that could lead to cancer, then you no longer need Pap tests. Even if you no longer need a Pap test, a regular exam is a good idea to make sure no other problems are starting.   - If you are between ages 109 and 17 years, and you have had normal Pap tests going back 10 years, you no longer need Pap tests. Even if you no longer need a Pap test, a regular exam is a good idea to make sure no other problems are starting.   - If you have had past treatment for cervical cancer or a condition that could lead to cancer, you need Pap tests and screening for cancer for at least 20 years after your treatment.   - If Pap tests have been discontinued, risk factors (such as a  new sexual partner) need to be reassessed to determine if screening should be resumed.   - The HPV test is an additional test that may be used for cervical cancer screening. The HPV test looks for the virus that can cause the cell changes on the cervix. The cells collected during the Pap test can be tested for HPV. The HPV test could be used to screen women aged 69 years and older, and should be used in women of any age who have unclear Pap test results. After the age of 57, women should have HPV testing at the same frequency as a Pap test.   Colorectal cancer can be detected and often prevented. Most routine colorectal cancer screening begins at the age of 65 years and continues through age 6 years. However, your health care provider may recommend screening at an earlier age if you have risk factors for colon cancer. On a yearly basis, your health care provider may provide home test kits to check for hidden blood in the stool.  Use of a small camera at the end of a tube, to directly examine the colon (sigmoidoscopy or colonoscopy), can detect the earliest forms of colorectal cancer. Talk to your health care provider about this at age 38, when routine screening begins. Direct exam of the colon should be repeated every 5 -10 years through age 24 years, unless early forms of pre-cancerous polyps or small growths are found.   People who are at an increased risk for hepatitis B should be screened for this virus. You are considered at high risk for hepatitis B if:  -You were born in a country where hepatitis B occurs often. Talk with your health care provider about which countries are considered high risk.  - Your parents were born in a high-risk country and you have not received a shot to protect against hepatitis B (hepatitis B vaccine).  - You have HIV or AIDS.  - You use needles to inject street drugs.  - You live with, or have sex with, someone who has Hepatitis B.  - You get hemodialysis  treatment.  - You take certain medicines for conditions like cancer, organ transplantation, and  autoimmune conditions.   Hepatitis C blood testing is recommended for all people born from [birth year] through 1964 and any individual with known risks for hepatitis C.   Practice safe sex. Use condoms and avoid high-risk sexual practices to reduce the spread of sexually transmitted infections (STIs). STIs include gonorrhea, chlamydia, syphilis, trichomonas, herpes, HPV, and human immunodeficiency virus (HIV). Herpes, HIV, and HPV are viral illnesses that have no cure. They can result in disability, cancer, and death. Sexually active women aged 56 years and younger should be checked for chlamydia. Older women with new or multiple partners should also be tested for chlamydia. Testing for other STIs is recommended if you are sexually active and at increased risk.   Osteoporosis is a disease in which the bones lose minerals and strength with aging. This can result in serious bone fractures or breaks. The risk of osteoporosis can be identified using a bone density scan. Women ages 91 years and over and women at risk for fractures or osteoporosis should discuss screening with their health care providers. Ask your health care provider whether you should take a calcium supplement or vitamin D to There are also several preventive steps women can take to avoid osteoporosis and resulting fractures or to keep osteoporosis from worsening. -->Recommendations include:  Eat a balanced diet high in fruits, vegetables, calcium, and vitamins.  Get enough calcium. The recommended total intake of is 1,200 mg daily; for best absorption, if taking supplements, divide doses into 250-500 mg doses throughout the day. Of the two types of calcium, calcium carbonate is best absorbed when taken with food but calcium citrate can be taken on an empty stomach.  Get enough vitamin D. [employer] and the [employer] recommend at  least 1,000 IU per day for women age 79 and over who are at risk of vitamin D deficiency. Vitamin D deficiency can be caused by inadequate sun exposure (for example, those who live in [place]).  Avoid alcohol and smoking. Heavy alcohol intake (more than 7 drinks per week) increases the risk of falls and hip fracture and women smokers tend to lose bone more rapidly and have lower bone mass than nonsmokers. Stopping smoking is one of the most important changes women can make to improve their health and decrease risk for disease.  Be physically active every day. Weight-bearing exercise (for example, fast walking, hiking, jogging, and weight training) may strengthen bones or slow the rate of bone loss that comes with aging. Balancing and muscle-strengthening exercises can reduce the risk of falling and fracture.  Consider therapeutic medications. Currently, several types of effective drugs are available. Healthcare providers can recommend the type most appropriate for each woman.  Eliminate environmental factors that may contribute to accidents. Falls cause nearly 90% of all osteoporotic fractures, so reducing this risk is an important bone-health strategy. Measures include ample lighting, removing obstructions to walking, using nonskid rugs on floors, and placing mats and/or grab bars in showers.  Be aware of medication side effects. Some common medicines make bones weaker. These include a type of steroid drug called glucocorticoids used for arthritis and asthma, some antiseizure drugs, certain sleeping pills, treatments for endometriosis, and some cancer drugs. An overactive thyroid gland or using too much thyroid hormone for an underactive thyroid can also be a problem. If you are taking these medicines, talk to your doctor about what you can do to help protect your bones.reduce the rate of osteoporosis.    Menopause can be associated with physical symptoms  and risks. Hormone replacement  therapy is available to decrease symptoms and risks. You should talk to your health care provider about whether hormone replacement therapy is right for you.   Use sunscreen. Apply sunscreen liberally and repeatedly throughout the day. You should seek shade when your shadow is shorter than you. Protect yourself by wearing long sleeves, pants, a wide-brimmed hat, and sunglasses year round, whenever you are outdoors.   Once a month, do a whole body skin exam, using a mirror to look at the skin on your back. Tell your health care provider of new moles, moles that have irregular borders, moles that are larger than a pencil eraser, or moles that have changed in shape or color.   -Stay current with required vaccines (immunizations).   Influenza vaccine. All adults should be immunized every year.  Tetanus, diphtheria, and acellular pertussis (Td, Tdap) vaccine. Pregnant women should receive 1 dose of Tdap vaccine during each pregnancy. The dose should be obtained regardless of the length of time since the last dose. Immunization is preferred during the 27th 36th week of gestation. An adult who has not previously received Tdap or who does not know her vaccine status should receive 1 dose of Tdap. This initial dose should be followed by tetanus and diphtheria toxoids (Td) booster doses every 10 years. Adults with an unknown or incomplete history of completing a 3-dose immunization series with Td-containing vaccines should begin or complete a primary immunization series including a Tdap dose. Adults should receive a Td booster every 10 years.  Varicella vaccine. An adult without evidence of immunity to varicella should receive 2 doses or a second dose if she has previously received 1 dose. Pregnant females who do not have evidence of immunity should receive the first dose after pregnancy. This first dose should be obtained before leaving the health care facility. The second dose should be obtained 4 8 weeks  after the first dose.  Human papillomavirus (HPV) vaccine. Females aged 70 26 years who have not received the vaccine previously should obtain the 3-dose series. The vaccine is not recommended for use in pregnant females. However, pregnancy testing is not needed before receiving a dose. If a female is found to be pregnant after receiving a dose, no treatment is needed. In that case, the remaining doses should be delayed until after the pregnancy. Immunization is recommended for any person with an immunocompromised condition through the age of 74 years if she did not get any or all doses earlier. During the 3-dose series, the second dose should be obtained 4 8 weeks after the first dose. The third dose should be obtained 24 weeks after the first dose and 16 weeks after the second dose.  Zoster vaccine. One dose is recommended for adults aged 75 years or older unless certain conditions are present.  Measles, mumps, and rubella (MMR) vaccine. Adults born before [birth year] generally are considered immune to measles and mumps. Adults born in [birth year] or later should have 1 or more doses of MMR vaccine unless there is a contraindication to the vaccine or there is laboratory evidence of immunity to each of the three diseases. A routine second dose of MMR vaccine should be obtained at least 28 days after the first dose for students attending postsecondary schools, health care workers, or international travelers. People who received inactivated measles vaccine or an unknown type of measles vaccine during 1963 1967 should receive 2 doses of MMR vaccine. People who received inactivated mumps vaccine or an unknown  type of mumps vaccine before 1979 and are at high risk for mumps infection should consider immunization with 2 doses of MMR vaccine. For females of childbearing age, rubella immunity should be determined. If there is no evidence of immunity, females who are not pregnant should be vaccinated. If there is no evidence of  immunity, females who are pregnant should delay immunization until after pregnancy. Unvaccinated health care workers born before [birth year] who lack laboratory evidence of measles, mumps, or rubella immunity or laboratory confirmation of disease should consider measles and mumps immunization with 2 doses of MMR vaccine or rubella immunization with 1 dose of MMR vaccine.  Pneumococcal 13-valent conjugate (PCV13) vaccine. When indicated, a person who is uncertain of her immunization history and has no record of immunization should receive the PCV13 vaccine. An adult aged 86 years or older who has certain medical conditions and has not been previously immunized should receive 1 dose of PCV13 vaccine. This PCV13 should be followed with a dose of pneumococcal polysaccharide (PPSV23) vaccine. The PPSV23 vaccine dose should be obtained at least 8 weeks after the dose of PCV13 vaccine. An adult aged 47 years or older who has certain medical conditions and previously received 1 or more doses of PPSV23 vaccine should receive 1 dose of PCV13. The PCV13 vaccine dose should be obtained 1 or more years after the last PPSV23 vaccine dose.  Pneumococcal polysaccharide (PPSV23) vaccine. When PCV13 is also indicated, PCV13 should be obtained first. All adults aged 99 years and older should be immunized. An adult younger than age 9 years who has certain medical conditions should be immunized. Any person who resides in a nursing home or long-term care facility should be immunized. An adult smoker should be immunized. People with an immunocompromised condition and certain other conditions should receive both PCV13 and PPSV23 vaccines. People with human immunodeficiency virus (HIV) infection should be immunized as soon as possible after diagnosis. Immunization during chemotherapy or radiation therapy should be avoided. Routine use of PPSV23 vaccine is not recommended for American Indians, [place] Natives, or people younger than 65 years  unless there are medical conditions that require PPSV23 vaccine. When indicated, people who have unknown immunization and have no record of immunization should receive PPSV23 vaccine. One-time revaccination 5 years after the first dose of PPSV23 is recommended for people aged 65 64 years who have chronic kidney failure, nephrotic syndrome, asplenia, or immunocompromised conditions. People who received 1 2 doses of PPSV23 before age 6 years should receive another dose of PPSV23 vaccine at age 59 years or later if at least 5 years have passed since the previous dose. Doses of PPSV23 are not needed for people immunized with PPSV23 at or after age 27 years.  Meningococcal vaccine. Adults with asplenia or persistent complement component deficiencies should receive 2 doses of quadrivalent meningococcal conjugate (MenACWY-D) vaccine. The doses should be obtained at least 2 months apart. Microbiologists working with certain meningococcal bacteria, [employer] recruits, people at risk during an outbreak, and people who travel to or live in countries with a high rate of meningitis should be immunized. A first-year college student up through age 55 years who is living in a residence hall should receive a dose if she did not receive a dose on or after her 16th birthday. Adults who have certain high-risk conditions should receive one or more doses of vaccine.  Hepatitis A vaccine. Adults who wish to be protected from this disease, have certain high-risk conditions, work with hepatitis A-infected animals, work  in hepatitis A research labs, or travel to or work in countries with a high rate of hepatitis A should be immunized. Adults who were previously unvaccinated and who anticipate close contact with an international adoptee during the first 60 days after arrival in the [place] from a country with a high rate of hepatitis A should be immunized.  Hepatitis B vaccine.  Adults who wish to be protected from this disease,  have certain high-risk conditions, may be exposed to blood or other infectious body fluids, are household contacts or sex partners of hepatitis B positive people, are clients or workers in certain care facilities, or travel to or work in countries with a high rate of hepatitis B should be immunized.  Haemophilus influenzae type b (Hib) vaccine. A previously unvaccinated person with asplenia or sickle cell disease or having a scheduled splenectomy should receive 1 dose of Hib vaccine. Regardless of previous immunization, a recipient of a hematopoietic stem cell transplant should receive a 3-dose series 6 12 months after her successful transplant. Hib vaccine is not recommended for adults with HIV infection.  Preventive Services / Frequency Ages 51 to 39years  Blood pressure check.** / Every 1 to 2 years.  Lipid and cholesterol check.** / Every 5 years beginning at age 83.  Clinical breast exam.** / Every 3 years for women in their 32s and 27s.  BRCA-related cancer risk assessment.** / For women who have family members with a BRCA-related cancer (breast, ovarian, tubal, or peritoneal cancers).  Pap test.** / Every 2 years from ages 4 through 74. Every 3 years starting at age 66 through age 48 or 18 with a history of 3 consecutive normal Pap tests.  HPV screening.** / Every 3 years from ages 14 through ages 73 to 30 with a history of 3 consecutive normal Pap tests.  Hepatitis C blood test.** / For any individual with known risks for hepatitis C.  Skin self-exam. / Monthly.  Influenza vaccine. / Every year.  Tetanus, diphtheria, and acellular pertussis (Tdap, Td) vaccine.** / Consult your health care provider. Pregnant women should receive 1 dose of Tdap vaccine during each pregnancy. 1 dose of Td every 10 years.  Varicella vaccine.** / Consult your health care provider. Pregnant females who do not have evidence of immunity should receive the first dose after pregnancy.  HPV vaccine. / 3  doses over 6 months, if 31 and younger. The vaccine is not recommended for use in pregnant females. However, pregnancy testing is not needed before receiving a dose.  Measles, mumps, rubella (MMR) vaccine.** / You need at least 1 dose of MMR if you were born in 1957 or later. You may also need a 2nd dose. For females of childbearing age, rubella immunity should be determined. If there is no evidence of immunity, females who are not pregnant should be vaccinated. If there is no evidence of immunity, females who are pregnant should delay immunization until after pregnancy.  Pneumococcal 13-valent conjugate (PCV13) vaccine.** / Consult your health care provider.  Pneumococcal polysaccharide (PPSV23) vaccine.** / 1 to 2 doses if you smoke cigarettes or if you have certain conditions.  Meningococcal vaccine.** / 1 dose if you are age 63 to 48 years and a Market researcher living in a residence hall, or have one of several medical conditions, you need to get vaccinated against meningococcal disease. You may also need additional booster doses.  Hepatitis A vaccine.** / Consult your health care provider.  Hepatitis B vaccine.** / Consult your  health care provider.  Haemophilus influenzae type b (Hib) vaccine.** / Consult your health care provider.  Ages 55 to 64years  Blood pressure check.** / Every 1 to 2 years.  Lipid and cholesterol check.** / Every 5 years beginning at age 106 years.  Lung cancer screening. / Every year if you are aged 26 80 years and have a 30-pack-year history of smoking and currently smoke or have quit within the past 15 years. Yearly screening is stopped once you have quit smoking for at least 15 years or develop a health problem that would prevent you from having lung cancer treatment.  Clinical breast exam.** / Every year after age 20 years.  BRCA-related cancer risk assessment.** / For women who have family members with a BRCA-related cancer (breast, ovarian,  tubal, or peritoneal cancers).  Mammogram.** / Every year beginning at age 42 years and continuing for as long as you are in good health. Consult with your health care provider.  Pap test.** / Every 3 years starting at age 58 years through age 6 or 85 years with a history of 3 consecutive normal Pap tests.  HPV screening.** / Every 3 years from ages 68 years through ages 66 to 30 years with a history of 3 consecutive normal Pap tests.  Fecal occult blood test (FOBT) of stool. / Every year beginning at age 64 years and continuing until age 4 years. You may not need to do this test if you get a colonoscopy every 10 years.  Flexible sigmoidoscopy or colonoscopy.** / Every 5 years for a flexible sigmoidoscopy or every 10 years for a colonoscopy beginning at age 63 years and continuing until age 63 years.  Hepatitis C blood test.** / For all people born from [birth year] through 1964 and any individual with known risks for hepatitis C.  Skin self-exam. / Monthly.  Influenza vaccine. / Every year.  Tetanus, diphtheria, and acellular pertussis (Tdap/Td) vaccine.** / Consult your health care provider. Pregnant women should receive 1 dose of Tdap vaccine during each pregnancy. 1 dose of Td every 10 years.  Varicella vaccine.** / Consult your health care provider. Pregnant females who do not have evidence of immunity should receive the first dose after pregnancy.  Zoster vaccine.** / 1 dose for adults aged 82 years or older.  Measles, mumps, rubella (MMR) vaccine.** / You need at least 1 dose of MMR if you were born in 1957 or later. You may also need a 2nd dose. For females of childbearing age, rubella immunity should be determined. If there is no evidence of immunity, females who are not pregnant should be vaccinated. If there is no evidence of immunity, females who are pregnant should delay immunization until after pregnancy.  Pneumococcal 13-valent conjugate (PCV13) vaccine.** / Consult your health  care provider.  Pneumococcal polysaccharide (PPSV23) vaccine.** / 1 to 2 doses if you smoke cigarettes or if you have certain conditions.  Meningococcal vaccine.** / Consult your health care provider.  Hepatitis A vaccine.** / Consult your health care provider.  Hepatitis B vaccine.** / Consult your health care provider.  Haemophilus influenzae type b (Hib) vaccine.** / Consult your health care provider.  Ages 26 years and over  Blood pressure check.** / Every 1 to 2 years.  Lipid and cholesterol check.** / Every 5 years beginning at age 81 years.  Lung cancer screening. / Every year if you are aged 50 80 years and have a 30-pack-year history of smoking and currently smoke or have quit within the past  15 years. Yearly screening is stopped once you have quit smoking for at least 15 years or develop a health problem that would prevent you from having lung cancer treatment.  Clinical breast exam.** / Every year after age 7 years.  BRCA-related cancer risk assessment.** / For women who have family members with a BRCA-related cancer (breast, ovarian, tubal, or peritoneal cancers).  Mammogram.** / Every year beginning at age 27 years and continuing for as long as you are in good health. Consult with your health care provider.  Pap test.** / Every 3 years starting at age 56 years through age 65 or 44 years with 3 consecutive normal Pap tests. Testing can be stopped between 65 and 70 years with 3 consecutive normal Pap tests and no abnormal Pap or HPV tests in the past 10 years.  HPV screening.** / Every 3 years from ages 51 years through ages 22 or 20 years with a history of 3 consecutive normal Pap tests. Testing can be stopped between 65 and 70 years with 3 consecutive normal Pap tests and no abnormal Pap or HPV tests in the past 10 years.  Fecal occult blood test (FOBT) of stool. / Every year beginning at age 42 years and continuing until age 76 years. You may not need to do this test if  you get a colonoscopy every 10 years.  Flexible sigmoidoscopy or colonoscopy.** / Every 5 years for a flexible sigmoidoscopy or every 10 years for a colonoscopy beginning at age 63 years and continuing until age 71 years.  Hepatitis C blood test.** / For all people born from [birth year] through 1964 and any individual with known risks for hepatitis C.  Osteoporosis screening.** / A one-time screening for women ages 73 years and over and women at risk for fractures or osteoporosis.  Skin self-exam. / Monthly.  Influenza vaccine. / Every year.  Tetanus, diphtheria, and acellular pertussis (Tdap/Td) vaccine.** / 1 dose of Td every 10 years.  Varicella vaccine.** / Consult your health care provider.  Zoster vaccine.** / 1 dose for adults aged 16 years or older.  Pneumococcal 13-valent conjugate (PCV13) vaccine.** / Consult your health care provider.  Pneumococcal polysaccharide (PPSV23) vaccine.** / 1 dose for all adults aged 82 years and older.  Meningococcal vaccine.** / Consult your health care provider.  Hepatitis A vaccine.** / Consult your health care provider.  Hepatitis B vaccine.** / Consult your health care provider.  Haemophilus influenzae type b (Hib) vaccine.** / Consult your health care provider. ** Family history and personal history of risk and conditions may change your health care provider's recommendations. Document Released: 04/27/2001 Document Revised: 12/20/2012  [facility] Patient Information 2014 [place], [place].   EXERCISE AND DIET:  We recommended that you start or continue a regular exercise program for good health. Regular exercise means any activity that makes your heart beat faster and makes you sweat.  We recommend exercising at least 30 minutes per day at least 3 days a week, preferably 5.  We also recommend a diet low in fat and sugar / carbohydrates.  Inactivity, poor dietary choices and obesity can cause diabetes, heart attack, stroke, and kidney damage, among  others.     ALCOHOL AND SMOKING:  Women should limit their alcohol intake to no more than 7 drinks/beers/glasses of wine (combined, not each!) per week. Moderation of alcohol intake to this level decreases your risk of breast cancer and liver damage.  ( And of course, no recreational drugs are part of a healthy lifestyle.)  Also, you should not be smoking at all or even being exposed to second hand smoke. Most people know smoking can cause cancer, and various heart and lung diseases, but did you know it also contributes to weakening of your bones?  Aging of your skin?  Yellowing of your teeth and nails?   CALCIUM AND VITAMIN D:  Adequate intake of calcium and Vitamin D are recommended.  The recommendations for exact amounts of these supplements seem to change often, but generally speaking 600 mg of calcium (either carbonate or citrate) and 800 units of Vitamin D per day seems prudent. Certain women may benefit from higher intake of Vitamin D.  If you are among these women, your doctor will have told you during your visit.     PAP SMEARS:  Pap smears, to check for cervical cancer or precancers,  have traditionally been done yearly, although recent scientific advances have shown that most women can have pap smears less often.  However, every woman still should have a physical exam from her gynecologist or primary care physician every year. It will include a breast check, inspection of the vulva and vagina to check for abnormal growths or skin changes, a visual exam of the cervix, and then an exam to evaluate the size and shape of the uterus and ovaries.  And after 50 years of age, a rectal exam is indicated to check for rectal cancers. We will also provide age appropriate advice regarding health maintenance, like when you should have certain vaccines, screening for sexually transmitted diseases, bone density testing, colonoscopy, mammograms, etc.    MAMMOGRAMS:  All women over 39 years old should have  a yearly mammogram. Many facilities now offer a "3D" mammogram, which may cost around $50 extra out of pocket. If possible,  we recommend you accept the option to have the 3D mammogram performed.  It both reduces the number of women who will be called back for extra views which then turn out to be normal, and it is better than the routine mammogram at detecting truly abnormal areas.     COLONOSCOPY:  Colonoscopy to screen for colon cancer is recommended for all women at age 23.  We know, you hate the idea of the prep.  We agree, BUT, having colon cancer and not knowing it is worse!!  Colon cancer so often starts as a polyp that can be seen and removed at colonscopy, which can quite literally save your life!  And if your first colonoscopy is normal and you have no family history of colon cancer, most women don't have to have it again for 10 years.  Once every ten years, you can do something that may end up saving your life, right?  We will be happy to help you get it scheduled when you are ready.  Be sure to check your insurance coverage so you understand how much it will cost.  It may be covered as a preventative service at no cost, but you should check your particular policy.

## 2016-11-04 ENCOUNTER — Other Ambulatory Visit: Payer: 59

## 2016-11-04 DIAGNOSIS — Z8349 Family history of other endocrine, nutritional and metabolic diseases: Secondary | ICD-10-CM

## 2016-11-04 DIAGNOSIS — E782 Mixed hyperlipidemia: Secondary | ICD-10-CM

## 2016-11-04 DIAGNOSIS — E663 Overweight: Secondary | ICD-10-CM

## 2016-11-04 DIAGNOSIS — E559 Vitamin D deficiency, unspecified: Secondary | ICD-10-CM

## 2016-11-04 DIAGNOSIS — Z833 Family history of diabetes mellitus: Secondary | ICD-10-CM

## 2016-11-04 DIAGNOSIS — Z789 Other specified health status: Secondary | ICD-10-CM

## 2016-11-04 DIAGNOSIS — O24410 Gestational diabetes mellitus in pregnancy, diet controlled: Secondary | ICD-10-CM

## 2016-11-04 DIAGNOSIS — Z801 Family history of malignant neoplasm of trachea, bronchus and lung: Secondary | ICD-10-CM

## 2016-11-05 LAB — CBC WITH DIFFERENTIAL/PLATELET
BASOS: 1 %
Basophils Absolute: 0 10*3/uL (ref 0.0–0.2)
EOS (ABSOLUTE): 0.1 10*3/uL (ref 0.0–0.4)
Eos: 3 %
Hematocrit: 43.5 % (ref 34.0–46.6)
Hemoglobin: 14.3 g/dL (ref 11.1–15.9)
Immature Grans (Abs): 0 10*3/uL (ref 0.0–0.1)
Immature Granulocytes: 0 %
Lymphocytes Absolute: 1.6 10*3/uL (ref 0.7–3.1)
Lymphs: 34 %
MCH: 27.8 pg (ref 26.6–33.0)
MCHC: 32.9 g/dL (ref 31.5–35.7)
MCV: 85 fL (ref 79–97)
MONOS ABS: 0.4 10*3/uL (ref 0.1–0.9)
Monocytes: 9 %
NEUTROS ABS: 2.5 10*3/uL (ref 1.4–7.0)
Neutrophils: 53 %
PLATELETS: 223 10*3/uL (ref 150–379)
RBC: 5.14 x10E6/uL (ref 3.77–5.28)
RDW: 14 % (ref 12.3–15.4)
WBC: 4.8 10*3/uL (ref 3.4–10.8)

## 2016-11-05 LAB — COMPREHENSIVE METABOLIC PANEL
A/G RATIO: 1.9 (ref 1.2–2.2)
ALT: 24 IU/L (ref 0–32)
AST: 21 IU/L (ref 0–40)
Albumin: 4.6 g/dL (ref 3.5–5.5)
Alkaline Phosphatase: 85 IU/L (ref 39–117)
BILIRUBIN TOTAL: 0.4 mg/dL (ref 0.0–1.2)
BUN/Creatinine Ratio: 13 (ref 9–23)
BUN: 10 mg/dL (ref 6–24)
CHLORIDE: 102 mmol/L (ref 96–106)
CO2: 25 mmol/L (ref 20–29)
Calcium: 9.9 mg/dL (ref 8.7–10.2)
Creatinine, Ser: 0.77 mg/dL (ref 0.57–1.00)
GFR calc non Af Amer: 90 mL/min/{1.73_m2} (ref >59)
GFR, EST AFRICAN AMERICAN: 104 mL/min/{1.73_m2} (ref >59)
GLOBULIN, TOTAL: 2.4 g/dL (ref 1.5–4.5)
Glucose: 118 mg/dL — ABNORMAL HIGH (ref 65–99)
POTASSIUM: 4.6 mmol/L (ref 3.5–5.2)
SODIUM: 140 mmol/L (ref 134–144)
TOTAL PROTEIN: 7 g/dL (ref 6.0–8.5)

## 2016-11-05 LAB — VITAMIN D 25 HYDROXY (VIT D DEFICIENCY, FRACTURES): VIT D 25 HYDROXY: 46.3 ng/mL (ref 30.0–100.0)

## 2016-11-05 LAB — HEMOGLOBIN A1C
Est. average glucose Bld gHb Est-mCnc: 111 mg/dL
Hgb A1c MFr Bld: 5.5 % (ref 4.8–5.6)

## 2016-11-05 LAB — LIPID PANEL
CHOLESTEROL TOTAL: 258 mg/dL — AB (ref 100–199)
Chol/HDL Ratio: 6.8 ratio — ABNORMAL HIGH (ref 0.0–4.4)
HDL: 38 mg/dL — ABNORMAL LOW (ref >39)
LDL CALC: 171 mg/dL — AB (ref 0–99)
Triglycerides: 247 mg/dL — ABNORMAL HIGH (ref 0–149)
VLDL Cholesterol Cal: 49 mg/dL — ABNORMAL HIGH (ref 5–40)

## 2016-11-05 LAB — TSH: TSH: 1.96 u[IU]/mL (ref 0.450–4.500)

## 2016-12-06 ENCOUNTER — Encounter: Payer: Self-pay | Admitting: Family Medicine

## 2017-03-25 ENCOUNTER — Ambulatory Visit (INDEPENDENT_AMBULATORY_CARE_PROVIDER_SITE_OTHER): Payer: 59 | Admitting: Family Medicine

## 2017-03-25 ENCOUNTER — Encounter: Payer: Self-pay | Admitting: Family Medicine

## 2017-03-25 VITALS — BP 138/90 | HR 92 | Temp 98.4°F | Ht 66.0 in | Wt 176.9 lb

## 2017-03-25 DIAGNOSIS — J329 Chronic sinusitis, unspecified: Secondary | ICD-10-CM

## 2017-03-25 DIAGNOSIS — H6980 Other specified disorders of Eustachian tube, unspecified ear: Secondary | ICD-10-CM | POA: Diagnosis not present

## 2017-03-25 MED ORDER — PREDNISONE 20 MG PO TABS: ORAL_TABLET | ORAL | 0 refills | Status: DC

## 2017-03-25 MED ORDER — CEFDINIR 300 MG PO CAPS: 300.0000 mg | ORAL_CAPSULE | Freq: Two times a day (BID) | ORAL | 0 refills | Status: DC

## 2017-03-25 NOTE — Patient Instructions (Signed)
Take the prednisone today and please follow all directions.  Start the [place] as well today.  Please note again there is a slight cross-reactivity between penicillin and Omnicef but it is unlikely you have side effects.  Please let me know if any occur we can always switch her to clindamycin     Symptoms for a viral upper respiratory tract infection usually last 3-7 days but can stretch out to 2-3 weeks before you're feeling back to normal.  Your symptoms should not worsen after 7-10 days and if they truely do, please notify our office, as you may need antibiotics.  You can use over-the-counter afrin nasal spray for up to 3 days (NO longer than that) which will help acutely with nasal drainage/ congestion short term.   Also, sterile saline nasal rinses, such as Milta Deiters med or AYR sinus rinses, can be very helpful and should be done twice daily- especially throughout the allergy season.   Remember you should use distilled water or previously boiled water to do this.   You can also use an over the counter cold and flu medication such as Tylenol Severe Cold and Sinus/Flu or Dayquil, Nyquil and the like, which will help with cough, congestion, headache/ pain, fevers/chills etc.  Please note, if you being treated for hypertension or have high blood pressure, you should be using the cold meds designated "HBP".    Unfortunately, antibiotics are not helpful for viral infections.   Wash your hands frequently, as you did not want to get those around you sick as well. Never sneeze or cough on others.  And you should not be going to school or work if you are running a temperature of 100.5 or more on two separate occasions.   Drink plenty of fluids and stay hydrated, especially if you are running fevers.  We don't know why, but chicken soup also helps, try it! :)

## 2017-03-25 NOTE — Progress Notes (Signed)
Acute Care Office visit  Assessment and plan:  1. Sinusitis, unspecified chronicity, unspecified location   2. Dysfunction of Eustachian tube, unspecified laterality    - Viral vs Allergic vs Bacterial causes for pt's symptoms reveiwed.    - she is flying in airplane early next week and desires "aggressive treatment to make her all better so she can fly".  - Supportive care and various OTC medications discussed in addition to any prescribed.  - Various antibiotics reviewed with the patient, including the reasons we use different antibiotics for different infections. The importance of not overmedicating a bacterial infection was reviewed with the patient.  - Cephalosporin Omnicef ABX prescribed to avoid penicillin allergy, along with Prednisone 20 mg taper (60 mg for 2 days, 40 mg for 2 days, and continue to taper down). If she begins having hives, she should quit taking her ABX. Clindamycin or Levaquin would be the next choice in this case.  - Pt should note there is a slight cross-reactivity between Penicillin and Omnicef. Pt will let us know immediately if any side-effects or reactions do occur.  - Pt knows to discontinue use of afrin after 3 days max. Continue use of AYR or Neilmed sinus rinses BID followed by flonase BID (one spray to each nostril). Perform sinus rinses particularly post-exposure to allergens or other exacerbations. Advised that the patient may also incorporate allegra or claritin PRN.   - Call or RTC if new symptoms, or if no improvement or worse over next several days.      Meds ordered this encounter  Medications  . predniSONE (DELTASONE) 20 MG tablet    Sig: Take 3 tabs po * 2 days, then 2 tabs for 2 d, then 1 tab 2 d, then 1/2 tab 2 days.    Dispense:  15 tablet    Refill:  0  . cefdinir (OMNICEF) 300 MG capsule    Sig: Take 1 capsule (300 mg total) by mouth 2 (two) times daily.    Dispense:  20 capsule    Refill:  0   Gross side effects, risk and  benefits, and alternatives of medications discussed with patient.  Patient is aware that all medications have potential side effects and we are unable to predict every sideeffect or drug-drug interaction that may occur.  Expresses verbal understanding and consents to current therapy plan and treatment regiment.   Education and routine counseling performed. Handouts provided.  Anticipatory guidance and routine counseling done re: condition, txmnt options and need for follow up. All questions of patient's were answered.  Return if symptoms worsen or fail to improve please let me know.  Please see AVS handed out to patient at the end of our visit for additional patient instructions/ counseling done pertaining to today's office visit.  Note: This document was partially repared using Dragon voice recognition software and may include unintentional dictation errors. This document serves as a record of services personally performed by Mellody Dance, DO. It was created on her behalf by Toni Amend, a trained medical scribe. The creation of this record is based on the scribe's personal observations and the provider's statements to them.   I have reviewed the above medical documentation for accuracy and completeness and I concur.  Mellody Dance 03/25/17 10:10 AM   Subjective:    Chief Complaint  Patient presents with  . Sinus Problem    sinus congestion and pressure, bilat ear pressure, post nasal drainage    HPI:  Pt presents with  current acute Sx for 2 days. Initial onset was around Christmas.  She has to fly in 2 days and would like aggressive treatment to resolve her symptoms before this.   C/o: Fullness in hears, left-sided face pain including her eye. Difficulty hearing at times. Confirms SOB with it.  Denies: Denies fevers, denies feeling like she has a fever currently, but did on initial onset around Christmas. No green discharge, had dark yellow, but got clear again. Has  had bleeding from her nose.     For symptoms patient has tried:  Neti pot beginning around Christmas, afrin for the past 2-3 days. Today would be her fourth day of afrin, so she discontinued use.  Overall getting:   No B or W  She began using the Neti pot around Christmas, at initial onset. Was doing better, but then 2 days ago her symptoms came back and entered her ears.  She is not using a humidifier at night.  Pt began having her allergy/SOB with penicillins starting at a young age, around [age].   Patient Care Team    Relationship Specialty Notifications Start End  Mellody Dance, DO PCP - General Family Medicine  09/06/16   Rosemary Holms, [facility] Consulting Physician Podiatry  09/06/16   Princess Bruins, MD Consulting Physician Obstetrics and Gynecology  09/06/16     Past medical history, Surgical history, Family history reviewed and noted below, Social history, Allergies, and Medications have been entered into the medical record, reviewed and changed as needed.   Allergies  Allergen Reactions  . Penicillins Hives and Shortness Of Breath    hives    Review of Systems: - see above HPI for pertinent positives General:   No F/C, wt loss Pulm:   No DIB, pleuritic chest pain Card:  No CP, palpitations Abd:  No n/v/d or pain Ext:  No inc edema from baseline   Objective:   Blood pressure 138/90, pulse 92, temperature 98.4 F (36.9 C), height 5\' 6"  (1.676 m), weight 176 lb 14.4 oz (80.2 kg), last menstrual period 03/11/2017, SpO2 97 %. Body mass index is 28.55 kg/m. General: Well Developed, well nourished, appropriate for stated age.  Neuro: Alert and oriented x3, extra-ocular muscles intact, sensation grossly intact.  HEENT:  Normocephalic, atraumatic, pupils equal round reactive to light, neck supple, no masses, no painful lymphadenopathy, TM's intact B/L, no acute findings. Nares- patent, clear d/c, OP- clear, mild erythema, TTP sinuses on left side. Skin: Warm and dry, no  gross rash. Cardiac: RRR, S1 S2,  no murmurs rubs or gallops.  Respiratory: ECTA B/L and A/P, Not using accessory muscles, speaking in full sentences- unlabored. Vascular:  No gross lower ext edema, cap RF less 2 sec. Psych: No HI/SI, judgement and insight good, Euthymic mood. Full Affect.

## 2017-08-15 ENCOUNTER — Encounter: Payer: Self-pay | Admitting: Adult Health

## 2017-08-15 ENCOUNTER — Ambulatory Visit (INDEPENDENT_AMBULATORY_CARE_PROVIDER_SITE_OTHER): Payer: 59 | Admitting: Adult Health

## 2017-08-15 VITALS — BP 117/81 | HR 81 | Temp 98.5°F | Ht 66.0 in | Wt 172.9 lb

## 2017-08-15 DIAGNOSIS — R197 Diarrhea, unspecified: Secondary | ICD-10-CM | POA: Insufficient documentation

## 2017-08-15 DIAGNOSIS — K921 Melena: Secondary | ICD-10-CM | POA: Insufficient documentation

## 2017-08-15 DIAGNOSIS — R1032 Left lower quadrant pain: Secondary | ICD-10-CM | POA: Diagnosis not present

## 2017-08-15 NOTE — Progress Notes (Signed)
Subjective:    Patient ID: Candice Newton, female    DOB: [birth year], 52 y.o.   MRN: [id number]  HPI:  Candice Newton presents with diarrhea that started >3 weeks ago. She reports 10-12 loose stools/day at for about 1.5 weeks, then started taking "fistfuls of Pepto-Bismol" and diarrhea reduced to 3-5 bouts/day over this past weekend. She reports that after about 5 days on Pepto-Bismol her tongue was black. She reports one occurrence of black tarry stool this morning. She reports intermittent, nausea without vomiting the last 1.5 weeks She reports constant LLQ pain that is described as "gnawing" and will worsen just prior to a BM and with abdominal palpation. She reports intermittent chills, however denies fever She denies recent travel outside of [place] prior to onset of sx's, however does travel out of state frequently for work. She reports appendectomy in 1909s She denies personal or family hx of GI disorders, however suspects her mother my have had IBS in her late 41's prior to her death at age 35 (she had metastatic ca, however origin of ca was never determined.  She has mets to lung, she had no personal hx of tobacco use) Candice Newton has never had colonoscopy She reports dramatically reducing diary/fat intake She reports slacking off on water intake, "b/c it seems to just run through me". She denies hematuria/hematochezia, again one bout of black tarry stool this am  Patient Care Team    Relationship Specialty Notifications Start End  Mellody Dance, DO PCP - General Family Medicine  09/06/16   Rosemary Holms, DPM Consulting Physician Podiatry  09/06/16   Princess Bruins, MD Consulting Physician Obstetrics and Gynecology  09/06/16     Patient Active Problem List   Diagnosis Date Noted  . Abdominal pain, LLQ 08/15/2017  . Diarrhea 08/15/2017  . Black tarry stools 08/15/2017  . h/o Gestational diabetes-  with all 3 children 09/06/2016  . Mixed hyperlipidemia- HA's from meds 09/06/2016  .  h/o Vitamin D deficiency 09/06/2016  . Overweight (BMI 25.0-29.9) 09/06/2016  . Seasonal allergies 09/06/2016  . Family history of diabetes mellitus in brother 09/06/2016  . Family history of high cholesterol- brother 09/06/2016  . Family history of lung cancer- parents due to smoking 09/06/2016  . Takes Multiple dietary supplements/ appetite suppressants  09/06/2016  . History of bunionectomy of left great toe 09/06/2016  . History of lumpectomy of right breast - benign 09/06/2016  . Breast mass, right; lumpectomy 2017 04/07/2015     Past Medical History:  Diagnosis Date  . Breast mass    right  . Hyperlipidemia      Past Surgical History:  Procedure Laterality Date  . APPENDECTOMY    . BREAST LUMPECTOMY WITH RADIOACTIVE SEED LOCALIZATION Right 04/07/2015   Procedure: RIGHT BREAST LUMPECTOMY WITH RADIOACTIVE SEED LOCALIZATION;  Surgeon: Fanny Skates, MD;  Location: [facility];  Service: General;  Laterality: Right;  . BUNIONECTOMY Left   . TONSILLECTOMY       Family History  Problem Relation Age of Onset  . Cancer Mother        lung  . Cancer Father        lung  . Diabetes Sister   . Hyperlipidemia Sister      Social History   Substance and Sexual Activity  Drug Use No     Social History   Substance and Sexual Activity  Alcohol Use Yes   Comment: social     Social History   Tobacco Use  Smoking Status Never Smoker  Smokeless Tobacco Never Used     Outpatient Encounter Medications as of 08/15/2017  Medication Sig  . cetirizine (ZYRTEC) 10 MG tablet Take 10 mg by mouth daily.  . cholecalciferol (VITAMIN D) 1000 units tablet Take 1,000 Units by mouth daily.  . Multiple Vitamin (MULTIVITAMIN) tablet Take 1 tablet by mouth daily.  Marland Kitchen pyridOXINE (VITAMIN B-6) 100 MG tablet Take 100 mg by mouth daily.  . [DISCONTINUED] cefdinir (OMNICEF) 300 MG capsule Take 1 capsule (300 mg total) by mouth 2 (two) times daily.  . [DISCONTINUED]  predniSONE (DELTASONE) 20 MG tablet Take 3 tabs po * 2 days, then 2 tabs for 2 d, then 1 tab 2 d, then 1/2 tab 2 days.   No facility-administered encounter medications on file as of 08/15/2017.     Allergies: Penicillins  Body mass index is 27.91 kg/m.  Blood pressure 117/81, pulse 81, temperature 98.5 F (36.9 C), temperature source Oral, height 5\' 6"  (1.676 m), weight 172 lb 14.4 oz (78.4 kg), SpO2 97 %.   Review of Systems  Constitutional: Positive for activity change, appetite change, chills and fatigue. Negative for diaphoresis, fever and unexpected weight change.  Respiratory: Negative for cough, chest tightness, shortness of breath, wheezing and stridor.   Cardiovascular: Negative for chest pain, palpitations and leg swelling.  Gastrointestinal: Positive for abdominal pain, diarrhea and nausea. Negative for abdominal distention, anal bleeding, blood in stool, constipation, rectal pain and vomiting.  Genitourinary: Negative for difficulty urinating, flank pain and hematuria.  Hematological: Does not bruise/bleed easily.       Objective:   Physical Exam  Constitutional: She is oriented to person, place, and time. She appears well-developed and well-nourished.  Non-toxic appearance. She does not appear ill. No distress.  HENT:  Head: Normocephalic and atraumatic.  Cardiovascular: Normal rate, regular rhythm and normal heart sounds.  No murmur heard. Pulmonary/Chest: Effort normal and breath sounds normal. No stridor. No respiratory distress. She has no wheezes. She has no rhonchi. She has no rales. She exhibits no tenderness.  Abdominal: Soft. Normal appearance, normal aorta and bowel sounds are normal. She exhibits no distension, no pulsatile liver, no fluid wave, no abdominal bruit, no ascites, no pulsatile midline mass and no mass. There is no hepatosplenomegaly. There is tenderness in the left lower quadrant. There is no rigidity, no rebound, no guarding, no CVA tenderness, no  tenderness at McBurney's point and negative Murphy's sign.  Neurological: She is alert and oriented to person, place, and time.  Skin: Skin is warm and dry. Capillary refill takes less than 2 seconds. No rash noted. She is not diaphoretic. No cyanosis or erythema. No pallor.  Psychiatric: She has a normal mood and affect. Her behavior is normal.  Nursing note and vitals reviewed.     Assessment & Plan:   1. Abdominal pain, LLQ   2. Diarrhea, unspecified type   3. Black tarry stools     Abdominal pain, LLQ Abdominal US Completed ordered Referral to GI placed  Black tarry stools Referral to GI placed  Diarrhea CBC and CMP drawn Stop OTC Pepto-Bismol BRAT DIET [place] Abd competed Referral to GI    FOLLOW-UP:  Return if symptoms worsen or fail to improve.

## 2017-08-15 NOTE — Patient Instructions (Addendum)
Diarrhea, Adult Diarrhea is frequent loose and watery bowel movements. Diarrhea can make you feel weak and cause you to become dehydrated. Dehydration can make you tired and thirsty, cause you to have a dry mouth, and decrease how often you urinate. Diarrhea typically lasts 2-3 days. However, it can last longer if it is a sign of something more serious. It is important to treat your diarrhea as told by your health care provider. Follow these instructions at home: Eating and drinking  Follow these recommendations as told by your health care provider:  Take an oral rehydration solution (ORS). This is a drink that is sold at pharmacies and retail stores.  Drink clear fluids, such as water, ice chips, diluted fruit juice, and low-calorie sports drinks.  Eat bland, easy-to-digest foods in small amounts as you are able. These foods include bananas, applesauce, rice, lean meats, toast, and crackers.  Avoid drinking fluids that contain a lot of sugar or caffeine, such as energy drinks, sports drinks, and soda.  Avoid alcohol.  Avoid spicy or fatty foods.  General instructions  Drink enough fluid to keep your urine clear or pale yellow.  Wash your hands often. If soap and water are not available, use hand sanitizer.  Make sure that all people in your household wash their hands well and often.  Take over-the-counter and prescription medicines only as told by your health care provider.  Rest at home while you recover.  Watch your condition for any changes.  Take a warm bath to relieve any burning or pain from frequent diarrhea episodes.  Keep all follow-up visits as told by your health care provider. This is important. Contact a health care provider if:  You have a fever.  Your diarrhea gets worse.  You have new symptoms.  You cannot keep fluids down.  You feel light-headed or dizzy.  You have a headache  You have muscle cramps. Get help right away if:  You have chest  pain.  You feel extremely weak or you faint.  You have bloody or black stools or stools that look like tar.  You have severe pain, cramping, or bloating in your abdomen.  You have trouble breathing or you are breathing very quickly.  Your heart is beating very quickly.  Your skin feels cold and clammy.  You feel confused.  You have signs of dehydration, such as: ? Dark urine, very little urine, or no urine. ? Cracked lips. ? Dry mouth. ? Sunken eyes. ? Sleepiness. ? Weakness. This information is not intended to replace advice given to you by your health care provider. Make sure you discuss any questions you have with your health care provider. Document Released: 02/19/2002 Document Revised: 07/10/2015 Document Reviewed: 11/05/2014 Elsevier Interactive Patient Education  2018 [place] Diet A bland diet consists of foods that do not have a lot of fat or fiber. Foods without fat or fiber are easier for the body to digest. They are also less likely to irritate your mouth, throat, stomach, and other parts of your gastrointestinal tract. A bland diet is sometimes called a BRAT diet. What is my plan? Your health care provider or dietitian may recommend specific changes to your diet to prevent and treat your symptoms, such as:  Eating small meals often.  Cooking food until it is soft enough to chew easily.  Chewing your food well.  Drinking fluids slowly.  Not eating foods that are very spicy, sour, or fatty.  Not eating citrus fruits,  such as oranges and grapefruit.  What do I need to know about this diet?  Eat a variety of foods from the bland diet food list.  Do not follow a bland diet longer than you have to.  Ask your health care provider whether you should take vitamins. What foods can I eat? Grains  Hot cereals, such as cream of wheat. Bread, crackers, or tortillas made from refined white flour. Rice. Vegetables Canned or cooked vegetables. Mashed  or boiled potatoes. Fruits Bananas. Applesauce. Other types of cooked or canned fruit with the skin and seeds removed, such as canned peaches or pears. Meats and Other Protein Sources Scrambled eggs. Creamy peanut butter or other nut butters. Lean, well-cooked meats, such as chicken or fish. Tofu. Soups or broths. Dairy Low-fat dairy products, such as milk, cottage cheese, or yogurt. Beverages Water. Herbal tea. Apple juice. Sweets and Desserts Pudding. Custard. Fruit gelatin. Ice cream. Fats and Oils Mild salad dressings. Canola or olive oil. The items listed above may not be a complete list of allowed foods or beverages. Contact your dietitian for more options. What foods are not recommended? Foods and ingredients that are often not recommended include:  Spicy foods, such as hot sauce or salsa.  Fried foods.  Sour foods, such as pickled or fermented foods.  Raw vegetables or fruits, especially citrus or berries.  Caffeinated drinks.  Alcohol.  Strongly flavored seasonings or condiments.  The items listed above may not be a complete list of foods and beverages that are not allowed. Contact your dietitian for more information. This information is not intended to replace advice given to you by your health care provider. Make sure you discuss any questions you have with your health care provider. Document Released: 06/23/2015 Document Revised: 08/07/2015 Document Reviewed: 03/13/2014 Elsevier Interactive Patient Education  2018 Short fluids, follow [employer]. Stop OTC Pepto-Bismol We will call you when lab results are available. Abdominal ultrasound order placed. Referral to GI placed, re: diarrhea, abdominal pain. Please call clinic with questions/concerns. FEEL BETTER!

## 2017-08-15 NOTE — Assessment & Plan Note (Signed)
Abdominal US Completed ordered Referral to GI placed

## 2017-08-15 NOTE — Assessment & Plan Note (Signed)
CBC and CMP drawn Stop OTC Pepto-Bismol BRAT DIET [place] Abd competed Referral to GI

## 2017-08-15 NOTE — Assessment & Plan Note (Signed)
Referral to GI placed

## 2017-08-16 LAB — COMPREHENSIVE METABOLIC PANEL
A/G RATIO: 1.9 (ref 1.2–2.2)
ALBUMIN: 4.7 g/dL (ref 3.5–5.5)
ALK PHOS: 87 IU/L (ref 39–117)
ALT: 29 IU/L (ref 0–32)
AST: 19 IU/L (ref 0–40)
BILIRUBIN TOTAL: 0.7 mg/dL (ref 0.0–1.2)
BUN / CREAT RATIO: 16 (ref 9–23)
BUN: 12 mg/dL (ref 6–24)
CHLORIDE: 103 mmol/L (ref 96–106)
CO2: 25 mmol/L (ref 20–29)
Calcium: 9.7 mg/dL (ref 8.7–10.2)
Creatinine, Ser: 0.76 mg/dL (ref 0.57–1.00)
GFR calc Af Amer: 105 mL/min/{1.73_m2} (ref >59)
GFR calc non Af Amer: 91 mL/min/{1.73_m2} (ref >59)
GLOBULIN, TOTAL: 2.5 g/dL (ref 1.5–4.5)
GLUCOSE: 105 mg/dL — AB (ref 65–99)
POTASSIUM: 4.8 mmol/L (ref 3.5–5.2)
SODIUM: 141 mmol/L (ref 134–144)
Total Protein: 7.2 g/dL (ref 6.0–8.5)

## 2017-08-16 LAB — CBC WITH DIFFERENTIAL/PLATELET
BASOS ABS: 0.1 10*3/uL (ref 0.0–0.2)
Basos: 1 %
EOS (ABSOLUTE): 0.1 10*3/uL (ref 0.0–0.4)
Eos: 3 %
HEMOGLOBIN: 14.8 g/dL (ref 11.1–15.9)
Hematocrit: 43.9 % (ref 34.0–46.6)
Immature Grans (Abs): 0 10*3/uL (ref 0.0–0.1)
Immature Granulocytes: 0 %
LYMPHS ABS: 1.5 10*3/uL (ref 0.7–3.1)
LYMPHS: 32 %
MCH: 28.1 pg (ref 26.6–33.0)
MCHC: 33.7 g/dL (ref 31.5–35.7)
MCV: 84 fL (ref 79–97)
MONOCYTES: 13 %
Monocytes Absolute: 0.6 10*3/uL (ref 0.1–0.9)
NEUTROS ABS: 2.5 10*3/uL (ref 1.4–7.0)
Neutrophils: 51 %
PLATELETS: 219 10*3/uL (ref 150–450)
RBC: 5.26 x10E6/uL (ref 3.77–5.28)
RDW: 13.9 % (ref 12.3–15.4)
WBC: 4.9 10*3/uL (ref 3.4–10.8)

## 2017-08-22 ENCOUNTER — Ambulatory Visit
Admission: RE | Admit: 2017-08-22 | Discharge: 2017-08-22 | Disposition: A | Discharge status: Home / Self Care | Payer: 59 | Source: Ambulatory Visit | Attending: Adult Health | Admitting: Adult Health

## 2017-08-22 ENCOUNTER — Encounter: Payer: Self-pay | Admitting: Gastroenterology

## 2017-08-22 DIAGNOSIS — R1032 Left lower quadrant pain: Secondary | ICD-10-CM

## 2017-08-22 DIAGNOSIS — R197 Diarrhea, unspecified: Secondary | ICD-10-CM

## 2017-08-22 DIAGNOSIS — K921 Melena: Secondary | ICD-10-CM

## 2017-10-17 ENCOUNTER — Ambulatory Visit: Payer: 59 | Admitting: Gastroenterology

## 2017-10-17 ENCOUNTER — Encounter: Payer: Self-pay | Admitting: Gastroenterology

## 2017-10-17 VITALS — BP 114/70 | HR 96 | Ht 65.5 in | Wt 171.1 lb

## 2017-10-17 DIAGNOSIS — R197 Diarrhea, unspecified: Secondary | ICD-10-CM

## 2017-10-17 DIAGNOSIS — Z1211 Encounter for screening for malignant neoplasm of colon: Secondary | ICD-10-CM | POA: Diagnosis not present

## 2017-10-17 MED ORDER — PEG-KCL-NACL-NASULF-NA ASC-C 140 G PO SOLR: 140.0000 g | ORAL | 0 refills | Status: DC

## 2017-10-17 NOTE — Progress Notes (Signed)
[facility] Consult Note:  History: Candice Newton 10/17/2017  Referring physician: Mellody Dance, DO  Reason for consult/chief complaint: Diarrhea (x 4 weeks 1 month ago); Melena ("); and Bloated (stomach feels a little tight)   Subjective  HPI:  This is a very pleasant 52 year old woman initially referred when she had a few weeks of crampy abdominal pain with loose stool and severe bloating.  She also was frequently passing black stool but while taking a lot of Pepto-Bismol.  She travels a lot for work, including internationally, and thought maybe she had picked up some infection.  She did not have any known sick contacts and had no new meds or any antibiotic use prior to the onset of symptoms.  Between the time the referral was made and now, her symptoms have resolved.  She now has some occasional left lower quadrant bloating. (See 08/15/17 PCP note for details)   ROS:  Review of Systems  Constitutional: Negative for appetite change and unexpected weight change.  HENT: Negative for mouth sores and voice change.   Eyes: Negative for pain and redness.  Respiratory: Negative for cough and shortness of breath.   Cardiovascular: Negative for chest pain and palpitations.  Genitourinary: Negative for dysuria and hematuria.  Musculoskeletal: Negative for arthralgias and myalgias.  Skin: Negative for pallor and rash.  Neurological: Negative for weakness and headaches.  Hematological: Negative for adenopathy.     Past Medical History: Past Medical History:  Diagnosis Date  . Breast mass    right  . Hyperlipidemia      Past Surgical History: Past Surgical History:  Procedure Laterality Date  . APPENDECTOMY    . BREAST LUMPECTOMY WITH RADIOACTIVE SEED LOCALIZATION Right 04/07/2015   Procedure: RIGHT BREAST LUMPECTOMY WITH RADIOACTIVE SEED LOCALIZATION;  Surgeon: Fanny Skates, MD;  Location: [facility];  Service: General;  Laterality: Right;    . BUNIONECTOMY Left   . TONSILLECTOMY       Family History: Family History  Problem Relation Age of Onset  . Cancer Mother        lung  . Cancer Father        lung  . Diabetes Brother   . Hyperlipidemia Brother   . Thyroid disease Brother   . Heart disease Maternal Grandmother     Social History: Social History   Socioeconomic History  . Marital status: Married    Spouse name: Not on file  . Number of children: 3  . Years of education: Not on file  . Highest education level: Not on file  Occupational History  . Occupation: Geographical information systems officer  . Financial resource strain: Not on file  . Food insecurity:    Worry: Not on file    Inability: Not on file  . Transportation needs:    Medical: Not on file    Non-medical: Not on file  Tobacco Use  . Smoking status: Never Smoker  . Smokeless tobacco: Never Used  Substance and Sexual Activity  . Alcohol use: Yes    Comment: social  . Drug use: No  . Sexual activity: Yes    Birth control/protection: IUD  Lifestyle  . Physical activity:    Days per week: Not on file    Minutes per session: Not on file  . Stress: Not on file  Relationships  . Social connections:    Talks on phone: Not on file    Gets together: Not on file    Attends religious  service: Not on file    Active member of club or organization: Not on file    Attends meetings of clubs or organizations: Not on file    Relationship status: Not on file  Other Topics Concern  . Not on file  Social History Narrative  . 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 Penicillins Hives and Shortness Of Breath    hives    Outpatient Meds: Current Outpatient Medications  Medication Sig Dispense Refill  . Multiple Vitamin (MULTIVITAMIN) tablet Take 1 tablet by mouth daily.    . cetirizine (ZYRTEC) 10 MG tablet Take 10 mg by mouth daily.    Marland Kitchen PEG-KCl-NaCl-NaSulf-Na Asc-C (PLENVU) 140 g SOLR Take 140 g by  mouth as directed. 1 each 0   No current facility-administered medications for this visit.       ___________________________________________________________________ Objective   Exam:  BP 114/70 (BP Location: Left Arm, Patient Position: Sitting, Cuff Size: Normal)   Pulse 96   Ht 5' 5.5" (1.664 m) Comment: height measured without shoes  Wt 171 lb 2 oz (77.6 kg)   BMI 28.04 kg/m    General: this is a(n) well-appearing woman  Eyes: sclera anicteric, no redness  ENT: oral mucosa moist without lesions, no cervical or supraclavicular lymphadenopathy, good dentition  CV: RRR without murmur, S1/S2, no JVD, no peripheral edema  Resp: clear to auscultation bilaterally, normal RR and effort noted  GI: soft, no tenderness, with active bowel sounds. No guarding or palpable organomegaly noted.  Skin; warm and dry, no rash or jaundice noted  Labs:  CBC Latest Ref Rng & Units 08/15/2017 11/04/2016  WBC 3.4 - 10.8 x10E3/uL 4.9 4.8  Hemoglobin 11.1 - 15.9 g/dL 14.8 14.3  Hematocrit 34.0 - 46.6 % 43.9 43.5  Platelets 150 - 450 x10E3/uL 219 223   CMP Latest Ref Rng & Units 08/15/2017 11/04/2016  Glucose 65 - 99 mg/dL 105(H) 118(H)  BUN 6 - 24 mg/dL 12 10  Creatinine 0.57 - 1.00 mg/dL 0.76 0.77  Sodium 134 - 144 mmol/L 141 140  Potassium 3.5 - 5.2 mmol/L 4.8 4.6  Chloride 96 - 106 mmol/L 103 102  CO2 20 - 29 mmol/L 25 25  Calcium 8.7 - 10.2 mg/dL 9.7 9.9  Total Protein 6.0 - 8.5 g/dL 7.2 7.0  Total Bilirubin 0.0 - 1.2 mg/dL 0.7 0.4  Alkaline Phos 39 - 117 IU/L 87 85  AST 0 - 40 IU/L 19 21  ALT 0 - 32 IU/L 29 24    Assessment: Encounter Diagnoses  Name Primary?  . Acute diarrhea Yes  . Special screening for malignant neoplasms, colon     It sounds like she may have had protracted symptoms after some acute infectious illness, all of which has resolved except for some mild residual bloating.  Plan:  She needs a screening colonoscopy.  We discussed the nature that procedure  including risks and benefits and she is agreeable.  The benefits and risks of the planned procedure were described in detail with the patient or (when appropriate) their health care proxy.  Risks were outlined as including, but not limited to, bleeding, infection, perforation, adverse medication reaction leading to cardiac or pulmonary decompensation, or pancreatitis (if ERCP).  The limitation of incomplete mucosal visualization was also discussed.  No guarantees or warranties were given.   Thank you for the courtesy of this consult.  Please call me with any questions or concerns.  Nelida Meuse III  CC: Mellody Dance, DO

## 2017-10-17 NOTE — Patient Instructions (Signed)
If you are age 51 or older, your body mass index should be between 23-30. Your Body mass index is 28.04 kg/m. If this is out of the aforementioned range listed, please consider follow up with your Primary Care Provider.  If you are age 38 or younger, your body mass index should be between 19-25. Your Body mass index is 28.04 kg/m. If this is out of the aformentioned range listed, please consider follow up with your Primary Care Provider.   You have been scheduled for a colonoscopy. Please follow written instructions given to you at your visit today.  Please pick up your prep supplies at the pharmacy within the next 1-3 days. If you use inhalers (even only as needed), please bring them with you on the day of your procedure. Your physician has requested that you go to www.startemmi.com and enter the access code given to you at your visit today. This web site gives a general overview about your procedure. However, you should still follow specific instructions given to you by our office regarding your preparation for the procedure.  It was a pleasure to see you today!  Dr. Loletha Carrow

## 2017-12-05 ENCOUNTER — Encounter: Payer: Self-pay | Admitting: Gastroenterology

## 2017-12-13 ENCOUNTER — Telehealth: Payer: Self-pay | Admitting: Gastroenterology

## 2017-12-13 NOTE — Telephone Encounter (Signed)
Pay no more than $50 coupon faxed to pts pharmacy.

## 2017-12-15 ENCOUNTER — Encounter: Payer: Self-pay | Admitting: Gastroenterology

## 2017-12-15 ENCOUNTER — Ambulatory Visit (AMBULATORY_SURGERY_CENTER): Payer: 59 | Admitting: Gastroenterology

## 2017-12-15 VITALS — BP 110/88 | HR 77 | Temp 97.5°F | Resp 12

## 2017-12-15 DIAGNOSIS — Z1211 Encounter for screening for malignant neoplasm of colon: Secondary | ICD-10-CM | POA: Diagnosis present

## 2017-12-15 MED ORDER — SODIUM CHLORIDE 0.9 % IV SOLN: 500.0000 mL | Freq: Once | INTRAVENOUS | Status: DC

## 2017-12-15 NOTE — Progress Notes (Signed)
A/ox3 pleased with MAC, report to RN 

## 2017-12-15 NOTE — Patient Instructions (Signed)
Repeat colonoscopy in 10 years.  YOU HAD AN ENDOSCOPIC PROCEDURE TODAY AT [facility]:   Refer to the procedure report that was given to you for any specific questions about what was found during the examination.  If the procedure report does not answer your questions, please call your gastroenterologist to clarify.  If you requested that your care partner not be given the details of your procedure findings, then the procedure report has been included in a sealed envelope for you to review at your convenience later.  YOU SHOULD EXPECT: Some feelings of bloating in the abdomen. Passage of more gas than usual.  Walking can help get rid of the air that was put into your GI tract during the procedure and reduce the bloating. If you had a lower endoscopy (such as a colonoscopy or flexible sigmoidoscopy) you may notice spotting of blood in your stool or on the toilet paper. If you underwent a bowel prep for your procedure, you may not have a normal bowel movement for a few days.  Please Note:  You might notice some irritation and congestion in your nose or some drainage.  This is from the oxygen used during your procedure.  There is no need for concern and it should clear up in a day or so.  SYMPTOMS TO REPORT IMMEDIATELY:   Following lower endoscopy (colonoscopy or flexible sigmoidoscopy):  Excessive amounts of blood in the stool  Significant tenderness or worsening of abdominal pains  Swelling of the abdomen that is new, acute  Fever of 100F or higher   For urgent or emergent issues, a gastroenterologist can be reached at any hour by calling [phone].   DIET:  We do recommend a small meal at first, but then you may proceed to your regular diet.  Drink plenty of fluids but you should avoid alcoholic beverages for 24 hours.  ACTIVITY:  You should plan to take it easy for the rest of today and you should NOT DRIVE or use heavy machinery until tomorrow (because of the sedation  medicines used during the test).    FOLLOW UP: Our staff will call the number listed on your records the next business day following your procedure to check on you and address any questions or concerns that you may have regarding the information given to you following your procedure. If we do not reach you, we will leave a message.  However, if you are feeling well and you are not experiencing any problems, there is no need to return our call.  We will assume that you have returned to your regular daily activities without incident.  If any biopsies were taken you will be contacted by phone or by letter within the next 1-3 weeks.  Please call us at [phone] if you have not heard about the biopsies in 3 weeks.    SIGNATURES/CONFIDENTIALITY: You and/or your care partner have signed paperwork which will be entered into your electronic medical record.  These signatures attest to the fact that that the information above on your After Visit Summary has been reviewed and is understood.  Full responsibility of the confidentiality of this discharge information lies with you and/or your care-partner.

## 2017-12-15 NOTE — Op Note (Signed)
[facility] [facility] Patient Name: Candice Newton Procedure Date: 12/15/2017 12:02 PM MRN: [id number] Endoscopist: [place]. Loletha Carrow , MD Age: 52 Referring MD:  Date of Birth: 1965-09-06 Gender: Female Account #: [id number] Procedure:                Colonoscopy Indications:              Screening for colorectal malignant neoplasm, This                            is the patient's first colonoscopy Medicines:                Monitored Anesthesia Care Procedure:                Pre-Anesthesia Assessment:                           - Prior to the procedure, a History and Physical                            was performed, and patient medications and                            allergies were reviewed. The patient's tolerance of                            previous anesthesia was also reviewed. The risks                            and benefits of the procedure and the sedation                            options and risks were discussed with the patient.                            All questions were answered, and informed consent                            was obtained. Prior Anticoagulants: The patient has                            taken no previous anticoagulant or antiplatelet                            agents. ASA Grade Assessment: II - A patient with                            mild systemic disease. After reviewing the risks                            and benefits, the patient was deemed in                            satisfactory condition to undergo the procedure.  After obtaining informed consent, the colonoscope                            was passed under direct vision. Throughout the                            procedure, the patient's blood pressure, pulse, and                            oxygen saturations were monitored continuously. The                            Colonoscope was introduced through the anus and                            advanced to the the terminal  ileum, with                            identification of the appendiceal orifice and IC                            valve. The colonoscopy was performed without                            difficulty. The patient tolerated the procedure                            well. The quality of the bowel preparation was                            excellent. The terminal ileum, ileocecal valve,                            appendiceal orifice, and rectum were photographed.                            The quality of the bowel preparation was evaluated                            using the BBPS [facility] Bowel Preparation Scale)                            with scores of: Right Colon = 3, Transverse Colon =                            3 and Left Colon = 3 (entire mucosa seen well with                            no residual staining, small fragments of stool or                            opaque liquid). The total BBPS score equals 9. Scope In: 12:11:46 PM Scope Out: 12:24:30 PM Scope  Withdrawal Time: 0 hours 9 minutes 2 seconds  Total Procedure Duration: 0 hours 12 minutes 44 seconds  Findings:                 The perianal and digital rectal examinations were                            normal.                           The entire examined colon appeared normal on direct                            and retroflexion views. Complications:            No immediate complications. Estimated Blood Loss:     Estimated blood loss: none. Impression:               - The entire examined colon is normal on direct and                            retroflexion views.                           - No specimens collected. Recommendation:           - Patient has a contact number available for                            emergencies. The signs and symptoms of potential                            delayed complications were discussed with the                            patient. Return to normal activities tomorrow.                             Written discharge instructions were provided to the                            patient.                           - Resume previous diet.                           - Continue present medications.                           - Repeat colonoscopy in 10 years for screening                            purposes. Shizuo Biskup L. Loletha Carrow, MD 12/15/2017 12:29:33 PM This report has been signed electronically.

## 2017-12-16 ENCOUNTER — Telehealth: Payer: Self-pay

## 2017-12-16 NOTE — Telephone Encounter (Signed)
  Follow up Call-  Call back number 12/15/2017  Post procedure Call Back phone  # [phone] cell  Permission to leave phone message Yes  Some recent data might be hidden     Left message on machine. Angela/Call-back

## 2017-12-16 NOTE — Telephone Encounter (Signed)
Left message

## 2018-04-14 ENCOUNTER — Telehealth: Payer: Self-pay | Admitting: Family Medicine

## 2018-04-14 ENCOUNTER — Telehealth: Payer: 59 | Admitting: Family

## 2018-04-14 DIAGNOSIS — B9689 Other specified bacterial agents as the cause of diseases classified elsewhere: Secondary | ICD-10-CM

## 2018-04-14 DIAGNOSIS — J019 Acute sinusitis, unspecified: Secondary | ICD-10-CM

## 2018-04-14 MED ORDER — DOXYCYCLINE HYCLATE 100 MG PO TABS: 100.0000 mg | ORAL_TABLET | Freq: Two times a day (BID) | ORAL | 0 refills | Status: DC

## 2018-04-14 NOTE — Progress Notes (Signed)

## 2018-04-14 NOTE — Telephone Encounter (Signed)
Pt called to schedule Acute OV today, unfortunately none available for PCP-- advised pt of UC & E-visit opportunity.  Patient also scheduled CPE & labs after advised not actual OV with Dr. Jenetta Downer since 03/2017.  --forwarding message to medical assistant to apply required labs order if necessary for May 16, 2018 appt.   --glh

## 2018-04-17 NOTE — Telephone Encounter (Signed)
Noted MPulliam, CMA/RT(R)  

## 2018-05-16 ENCOUNTER — Other Ambulatory Visit (INDEPENDENT_AMBULATORY_CARE_PROVIDER_SITE_OTHER): Payer: 59

## 2018-05-16 ENCOUNTER — Encounter: Payer: 59 | Admitting: Family Medicine

## 2018-05-16 DIAGNOSIS — Z0000 Encounter for general adult medical examination without abnormal findings: Secondary | ICD-10-CM

## 2018-05-16 DIAGNOSIS — O24410 Gestational diabetes mellitus in pregnancy, diet controlled: Secondary | ICD-10-CM

## 2018-05-16 DIAGNOSIS — E782 Mixed hyperlipidemia: Secondary | ICD-10-CM

## 2018-05-16 DIAGNOSIS — Z8342 Family history of familial hypercholesterolemia: Secondary | ICD-10-CM

## 2018-05-16 DIAGNOSIS — E663 Overweight: Secondary | ICD-10-CM

## 2018-05-16 DIAGNOSIS — Z833 Family history of diabetes mellitus: Secondary | ICD-10-CM

## 2018-05-16 DIAGNOSIS — E559 Vitamin D deficiency, unspecified: Secondary | ICD-10-CM

## 2018-05-17 LAB — COMPREHENSIVE METABOLIC PANEL
ALT: 31 IU/L (ref 0–32)
AST: 18 IU/L (ref 0–40)
Albumin/Globulin Ratio: 1.7 (ref 1.2–2.2)
Albumin: 4.2 g/dL (ref 3.8–4.9)
Alkaline Phosphatase: 81 IU/L (ref 39–117)
BUN/Creatinine Ratio: 21 (ref 9–23)
BUN: 14 mg/dL (ref 6–24)
Bilirubin Total: 0.6 mg/dL (ref 0.0–1.2)
CO2: 24 mmol/L (ref 20–29)
Calcium: 9.6 mg/dL (ref 8.7–10.2)
Chloride: 103 mmol/L (ref 96–106)
Creatinine, Ser: 0.66 mg/dL (ref 0.57–1.00)
GFR calc non Af Amer: 103 mL/min/{1.73_m2} (ref >59)
GFR, EST AFRICAN AMERICAN: 118 mL/min/{1.73_m2} (ref >59)
Globulin, Total: 2.5 g/dL (ref 1.5–4.5)
Glucose: 120 mg/dL — ABNORMAL HIGH (ref 65–99)
Potassium: 4.2 mmol/L (ref 3.5–5.2)
Sodium: 141 mmol/L (ref 134–144)
Total Protein: 6.7 g/dL (ref 6.0–8.5)

## 2018-05-17 LAB — CBC WITH DIFFERENTIAL/PLATELET
Basophils Absolute: 0 10*3/uL (ref 0.0–0.2)
Basos: 1 %
EOS (ABSOLUTE): 0.2 10*3/uL (ref 0.0–0.4)
Eos: 3 %
HEMOGLOBIN: 14.1 g/dL (ref 11.1–15.9)
Hematocrit: 42.8 % (ref 34.0–46.6)
Immature Grans (Abs): 0 10*3/uL (ref 0.0–0.1)
Immature Granulocytes: 0 %
Lymphocytes Absolute: 1.6 10*3/uL (ref 0.7–3.1)
Lymphs: 34 %
MCH: 28.4 pg (ref 26.6–33.0)
MCHC: 32.9 g/dL (ref 31.5–35.7)
MCV: 86 fL (ref 79–97)
Monocytes Absolute: 0.4 10*3/uL (ref 0.1–0.9)
Monocytes: 8 %
Neutrophils Absolute: 2.4 10*3/uL (ref 1.4–7.0)
Neutrophils: 54 %
Platelets: 200 10*3/uL (ref 150–450)
RBC: 4.96 x10E6/uL (ref 3.77–5.28)
RDW: 13.5 % (ref 11.7–15.4)
WBC: 4.6 10*3/uL (ref 3.4–10.8)

## 2018-05-17 LAB — T4, FREE: Free T4: 1.15 ng/dL (ref 0.82–1.77)

## 2018-05-17 LAB — LIPID PANEL
Chol/HDL Ratio: 6.3 ratio — ABNORMAL HIGH (ref 0.0–4.4)
Cholesterol, Total: 257 mg/dL — ABNORMAL HIGH (ref 100–199)
HDL: 41 mg/dL (ref >39)
LDL Calculated: 178 mg/dL — ABNORMAL HIGH (ref 0–99)
Triglycerides: 189 mg/dL — ABNORMAL HIGH (ref 0–149)
VLDL Cholesterol Cal: 38 mg/dL (ref 5–40)

## 2018-05-17 LAB — TSH: TSH: 2.19 u[IU]/mL (ref 0.450–4.500)

## 2018-05-17 LAB — HEMOGLOBIN A1C
Est. average glucose Bld gHb Est-mCnc: 114 mg/dL
Hgb A1c MFr Bld: 5.6 % (ref 4.8–5.6)

## 2018-05-17 LAB — VITAMIN D 25 HYDROXY (VIT D DEFICIENCY, FRACTURES): Vit D, 25-Hydroxy: 35 ng/mL (ref 30.0–100.0)

## 2018-06-13 ENCOUNTER — Encounter: Payer: 59 | Admitting: Family Medicine

## 2018-09-05 ENCOUNTER — Ambulatory Visit (INDEPENDENT_AMBULATORY_CARE_PROVIDER_SITE_OTHER): Payer: 59 | Admitting: Family Medicine

## 2018-09-05 ENCOUNTER — Other Ambulatory Visit: Payer: Self-pay | Admitting: Family Medicine

## 2018-09-05 ENCOUNTER — Other Ambulatory Visit: Payer: Self-pay

## 2018-09-05 ENCOUNTER — Encounter: Payer: Self-pay | Admitting: Family Medicine

## 2018-09-05 VITALS — BP 126/84 | HR 80 | Temp 99.0°F | Resp 97 | Ht 67.0 in | Wt 175.6 lb

## 2018-09-05 DIAGNOSIS — E663 Overweight: Secondary | ICD-10-CM

## 2018-09-05 DIAGNOSIS — Z23 Encounter for immunization: Secondary | ICD-10-CM

## 2018-09-05 DIAGNOSIS — E782 Mixed hyperlipidemia: Secondary | ICD-10-CM | POA: Diagnosis not present

## 2018-09-05 DIAGNOSIS — O24410 Gestational diabetes mellitus in pregnancy, diet controlled: Secondary | ICD-10-CM

## 2018-09-05 DIAGNOSIS — Z0000 Encounter for general adult medical examination without abnormal findings: Secondary | ICD-10-CM

## 2018-09-05 DIAGNOSIS — Z1231 Encounter for screening mammogram for malignant neoplasm of breast: Secondary | ICD-10-CM

## 2018-09-05 MED ORDER — ZOSTER VAC RECOMB ADJUVANTED 50 MCG/0.5ML IM SUSR: 0.5000 mL | Freq: Once | INTRAMUSCULAR | 0 refills | Status: AC

## 2018-09-05 NOTE — Patient Instructions (Addendum)
Please remember to call for your mammogram.  This was last done 4 of 2018 and this needs to be done yearly.  Also, please call your GYN about a Pap smear.  This was last done November 2012 and it should be done every 5 years at most.  Preventive Care for Adults, Female  A healthy lifestyle and preventive care can promote health and wellness. Preventive health guidelines for women include the following key practices.   A routine yearly physical is a good way to check with your health care provider about your health and preventive screening. It is a chance to share any concerns and updates on your health and to receive a thorough exam.   Visit your dentist for a routine exam and preventive care every 6 months. Brush your teeth twice a day and floss once a day. Good oral hygiene prevents tooth decay and gum disease.   The frequency of eye exams is based on your age, health, family medical history, use of contact lenses, and other factors. Follow your health care provider's recommendations for frequency of eye exams.   Eat a healthy diet. Foods like vegetables, fruits, whole grains, low-fat dairy products, and lean protein foods contain the nutrients you need without too many calories. Decrease your intake of foods high in solid fats, added sugars, and salt. Eat the right amount of calories for you.Get information about a proper diet from your health care provider, if necessary.   Regular physical exercise is one of the most important things you can do for your health. Most adults should get at least 150 minutes of moderate-intensity exercise (any activity that increases your heart rate and causes you to sweat) each week. In addition, most adults need muscle-strengthening exercises on 2 or more days a week.   Maintain a healthy weight. The body mass index (BMI) is a screening tool to identify possible weight problems. It provides an estimate of body fat based on height and weight. Your health care  provider can find your BMI, and can help you achieve or maintain a healthy weight.For adults 20 years and older:   - A BMI below 18.5 is considered underweight.   - A BMI of 18.5 to 24.9 is normal.   - A BMI of 25 to 29.9 is considered overweight.   - A BMI of 30 and above is considered obese.   Maintain normal blood lipids and cholesterol levels by exercising and minimizing your intake of trans and saturated fats.  Eat a balanced diet with plenty of fruit and vegetables. Blood tests for lipids and cholesterol should begin at age 15 and be repeated every 5 years minimum.  If your lipid or cholesterol levels are high, you are over 40, or you are at high risk for heart disease, you may need your cholesterol levels checked more frequently.Ongoing high lipid and cholesterol levels should be treated with medicines if diet and exercise are not working.   If you smoke, find out from your health care provider how to quit. If you do not use tobacco, do not start.   Lung cancer screening is recommended for adults aged 30-80 years who are at high risk for developing lung cancer because of a history of smoking. A yearly low-dose CT scan of the lungs is recommended for people who have at least a 30-pack-year history of smoking and are a current smoker or have quit within the past 15 years. A pack year of smoking is smoking an average of 1  pack of cigarettes a day for 1 year (for example: 1 pack a day for 30 years or 2 packs a day for 15 years). Yearly screening should continue until the smoker has stopped smoking for at least 15 years. Yearly screening should be stopped for people who develop a health problem that would prevent them from having lung cancer treatment.   If you are pregnant, do not drink alcohol. If you are breastfeeding, be very cautious about drinking alcohol. If you are not pregnant and choose to drink alcohol, do not have more than 1 drink per day. One drink is considered to be 12 ounces (355  mL) of beer, 5 ounces (148 mL) of wine, or 1.5 ounces (44 mL) of liquor.   Avoid use of street drugs. Do not share needles with anyone. Ask for help if you need support or instructions about stopping the use of drugs.   High blood pressure causes heart disease and increases the risk of stroke. Your blood pressure should be checked at least yearly.  Ongoing high blood pressure should be treated with medicines if weight loss and exercise do not work.   If you are 74-68 years old, ask your health care provider if you should take aspirin to prevent strokes.   Diabetes screening involves taking a blood sample to check your fasting blood sugar level. This should be done once every 3 years, after age 62, if you are within normal weight and without risk factors for diabetes. Testing should be considered at a younger age or be carried out more frequently if you are overweight and have at least 1 risk factor for diabetes.   Breast cancer screening is essential preventive care for women. You should practice "breast self-awareness."  This means understanding the normal appearance and feel of your breasts and may include breast self-examination.  Any changes detected, no matter how small, should be reported to a health care provider.  Women in their 88s and 30s should have a clinical breast exam (CBE) by a health care provider as part of a regular health exam every 1 to 3 years.  After age 69, women should have a CBE every year.  Starting at age 60, women should consider having a mammogram (breast X-ray test) every year.  Women who have a family history of breast cancer should talk to their health care provider about genetic screening.  Women at a high risk of breast cancer should talk to their health care providers about having an MRI and a mammogram every year.   -Breast cancer gene (BRCA)-related cancer risk assessment is recommended for women who have family members with BRCA-related cancers. BRCA-related  cancers include breast, ovarian, tubal, and peritoneal cancers. Having family members with these cancers may be associated with an increased risk for harmful changes (mutations) in the breast cancer genes BRCA1 and BRCA2. Results of the assessment will determine the need for genetic counseling and BRCA1 and BRCA2 testing.   The Pap test is a screening test for cervical cancer. A Pap test can show cell changes on the cervix that might become cervical cancer if left untreated. A Pap test is a procedure in which cells are obtained and examined from the lower end of the uterus (cervix).   - Women should have a Pap test starting at age 58.   - Between ages 105 and 15, Pap tests should be repeated every 2 years.   - Beginning at age 85, you should have a Pap test every 3  years as long as the past 3 Pap tests have been normal.   - Some women have medical problems that increase the chance of getting cervical cancer. Talk to your health care provider about these problems. It is especially important to talk to your health care provider if a new problem develops soon after your last Pap test. In these cases, your health care provider may recommend more frequent screening and Pap tests.   - The above recommendations are the same for women who have or have not gotten the vaccine for human papillomavirus (HPV).   - If you had a hysterectomy for a problem that was not cancer or a condition that could lead to cancer, then you no longer need Pap tests. Even if you no longer need a Pap test, a regular exam is a good idea to make sure no other problems are starting.   - If you are between ages 18 and 72 years, and you have had normal Pap tests going back 10 years, you no longer need Pap tests. Even if you no longer need a Pap test, a regular exam is a good idea to make sure no other problems are starting.   - If you have had past treatment for cervical cancer or a condition that could lead to cancer, you need Pap  tests and screening for cancer for at least 20 years after your treatment.   - If Pap tests have been discontinued, risk factors (such as a new sexual partner) need to be reassessed to determine if screening should be resumed.   - The HPV test is an additional test that may be used for cervical cancer screening. The HPV test looks for the virus that can cause the cell changes on the cervix. The cells collected during the Pap test can be tested for HPV. The HPV test could be used to screen women aged 11 years and older, and should be used in women of any age who have unclear Pap test results. After the age of 61, women should have HPV testing at the same frequency as a Pap test.   Colorectal cancer can be detected and often prevented. Most routine colorectal cancer screening begins at the age of 59 years and continues through age 57 years. However, your health care provider may recommend screening at an earlier age if you have risk factors for colon cancer. On a yearly basis, your health care provider may provide home test kits to check for hidden blood in the stool.  Use of a small camera at the end of a tube, to directly examine the colon (sigmoidoscopy or colonoscopy), can detect the earliest forms of colorectal cancer. Talk to your health care provider about this at age 31, when routine screening begins. Direct exam of the colon should be repeated every 5 -10 years through age 54 years, unless early forms of pre-cancerous polyps or small growths are found.   People who are at an increased risk for hepatitis B should be screened for this virus. You are considered at high risk for hepatitis B if:  -You were born in a country where hepatitis B occurs often. Talk with your health care provider about which countries are considered high risk.  - Your parents were born in a high-risk country and you have not received a shot to protect against hepatitis B (hepatitis B vaccine).  - You have HIV or AIDS.  -  You use needles to inject street drugs.  - You live  with, or have sex with, someone who has Hepatitis B.  - You get hemodialysis treatment.  - You take certain medicines for conditions like cancer, organ transplantation, and autoimmune conditions.   Hepatitis C blood testing is recommended for all people born from [birth year] through 1965 and any individual with known risks for hepatitis C.   Practice safe sex. Use condoms and avoid high-risk sexual practices to reduce the spread of sexually transmitted infections (STIs). STIs include gonorrhea, chlamydia, syphilis, trichomonas, herpes, HPV, and human immunodeficiency virus (HIV). Herpes, HIV, and HPV are viral illnesses that have no cure. They can result in disability, cancer, and death. Sexually active women aged 65 years and younger should be checked for chlamydia. Older women with new or multiple partners should also be tested for chlamydia. Testing for other STIs is recommended if you are sexually active and at increased risk.   Osteoporosis is a disease in which the bones lose minerals and strength with aging. This can result in serious bone fractures or breaks. The risk of osteoporosis can be identified using a bone density scan. Women ages 33 years and over and women at risk for fractures or osteoporosis should discuss screening with their health care providers. Ask your health care provider whether you should take a calcium supplement or vitamin D to There are also several preventive steps women can take to avoid osteoporosis and resulting fractures or to keep osteoporosis from worsening. -->Recommendations include:  Eat a balanced diet high in fruits, vegetables, calcium, and vitamins.  Get enough calcium. The recommended total intake of is 1,200 mg daily; for best absorption, if taking supplements, divide doses into 250-500 mg doses throughout the day. Of the two types of calcium, calcium carbonate is best absorbed when taken with food but  calcium citrate can be taken on an empty stomach.  Get enough vitamin D. [employer] and the [employer] recommend at least 1,000 IU per day for women age 76 and over who are at risk of vitamin D deficiency. Vitamin D deficiency can be caused by inadequate sun exposure (for example, those who live in [place]).  Avoid alcohol and smoking. Heavy alcohol intake (more than 7 drinks per week) increases the risk of falls and hip fracture and women smokers tend to lose bone more rapidly and have lower bone mass than nonsmokers. Stopping smoking is one of the most important changes women can make to improve their health and decrease risk for disease.  Be physically active every day. Weight-bearing exercise (for example, fast walking, hiking, jogging, and weight training) may strengthen bones or slow the rate of bone loss that comes with aging. Balancing and muscle-strengthening exercises can reduce the risk of falling and fracture.  Consider therapeutic medications. Currently, several types of effective drugs are available. Healthcare providers can recommend the type most appropriate for each woman.  Eliminate environmental factors that may contribute to accidents. Falls cause nearly 90% of all osteoporotic fractures, so reducing this risk is an important bone-health strategy. Measures include ample lighting, removing obstructions to walking, using nonskid rugs on floors, and placing mats and/or grab bars in showers.  Be aware of medication side effects. Some common medicines make bones weaker. These include a type of steroid drug called glucocorticoids used for arthritis and asthma, some antiseizure drugs, certain sleeping pills, treatments for endometriosis, and some cancer drugs. An overactive thyroid gland or using too much thyroid hormone for an underactive thyroid can also be a problem. If you are taking these  medicines, talk to your doctor about what you can do to help protect  your bones.reduce the rate of osteoporosis.    Menopause can be associated with physical symptoms and risks. Hormone replacement therapy is available to decrease symptoms and risks. You should talk to your health care provider about whether hormone replacement therapy is right for you.   Use sunscreen. Apply sunscreen liberally and repeatedly throughout the day. You should seek shade when your shadow is shorter than you. Protect yourself by wearing long sleeves, pants, a wide-brimmed hat, and sunglasses year round, whenever you are outdoors.   Once a month, do a whole body skin exam, using a mirror to look at the skin on your back. Tell your health care provider of new moles, moles that have irregular borders, moles that are larger than a pencil eraser, or moles that have changed in shape or color.   -Stay current with required vaccines (immunizations).   Influenza vaccine. All adults should be immunized every year.  Tetanus, diphtheria, and acellular pertussis (Td, Tdap) vaccine. Pregnant women should receive 1 dose of Tdap vaccine during each pregnancy. The dose should be obtained regardless of the length of time since the last dose. Immunization is preferred during the 27th 36th week of gestation. An adult who has not previously received Tdap or who does not know her vaccine status should receive 1 dose of Tdap. This initial dose should be followed by tetanus and diphtheria toxoids (Td) booster doses every 10 years. Adults with an unknown or incomplete history of completing a 3-dose immunization series with Td-containing vaccines should begin or complete a primary immunization series including a Tdap dose. Adults should receive a Td booster every 10 years.  Varicella vaccine. An adult without evidence of immunity to varicella should receive 2 doses or a second dose if she has previously received 1 dose. Pregnant females who do not have evidence of immunity should receive the first dose after  pregnancy. This first dose should be obtained before leaving the health care facility. The second dose should be obtained 4 8 weeks after the first dose.  Human papillomavirus (HPV) vaccine. Females aged 65 26 years who have not received the vaccine previously should obtain the 3-dose series. The vaccine is not recommended for use in pregnant females. However, pregnancy testing is not needed before receiving a dose. If a female is found to be pregnant after receiving a dose, no treatment is needed. In that case, the remaining doses should be delayed until after the pregnancy. Immunization is recommended for any person with an immunocompromised condition through the age of 33 years if she did not get any or all doses earlier. During the 3-dose series, the second dose should be obtained 4 8 weeks after the first dose. The third dose should be obtained 24 weeks after the first dose and 16 weeks after the second dose.  Zoster vaccine. One dose is recommended for adults aged 29 years or older unless certain conditions are present.  Measles, mumps, and rubella (MMR) vaccine. Adults born before [birth year] generally are considered immune to measles and mumps. Adults born in [birth year] or later should have 1 or more doses of MMR vaccine unless there is a contraindication to the vaccine or there is laboratory evidence of immunity to each of the three diseases. A routine second dose of MMR vaccine should be obtained at least 28 days after the first dose for students attending postsecondary schools, health care workers, or international travelers. People who received  inactivated measles vaccine or an unknown type of measles vaccine during 1963 1967 should receive 2 doses of MMR vaccine. People who received inactivated mumps vaccine or an unknown type of mumps vaccine before 1979 and are at high risk for mumps infection should consider immunization with 2 doses of MMR vaccine. For females of childbearing age, rubella immunity should  be determined. If there is no evidence of immunity, females who are not pregnant should be vaccinated. If there is no evidence of immunity, females who are pregnant should delay immunization until after pregnancy. Unvaccinated health care workers born before [birth year] who lack laboratory evidence of measles, mumps, or rubella immunity or laboratory confirmation of disease should consider measles and mumps immunization with 2 doses of MMR vaccine or rubella immunization with 1 dose of MMR vaccine.  Pneumococcal 13-valent conjugate (PCV13) vaccine. When indicated, a person who is uncertain of her immunization history and has no record of immunization should receive the PCV13 vaccine. An adult aged 72 years or older who has certain medical conditions and has not been previously immunized should receive 1 dose of PCV13 vaccine. This PCV13 should be followed with a dose of pneumococcal polysaccharide (PPSV23) vaccine. The PPSV23 vaccine dose should be obtained at least 8 weeks after the dose of PCV13 vaccine. An adult aged 46 years or older who has certain medical conditions and previously received 1 or more doses of PPSV23 vaccine should receive 1 dose of PCV13. The PCV13 vaccine dose should be obtained 1 or more years after the last PPSV23 vaccine dose.  Pneumococcal polysaccharide (PPSV23) vaccine. When PCV13 is also indicated, PCV13 should be obtained first. All adults aged 46 years and older should be immunized. An adult younger than age 74 years who has certain medical conditions should be immunized. Any person who resides in a nursing home or long-term care facility should be immunized. An adult smoker should be immunized. People with an immunocompromised condition and certain other conditions should receive both PCV13 and PPSV23 vaccines. People with human immunodeficiency virus (HIV) infection should be immunized as soon as possible after diagnosis. Immunization during chemotherapy or radiation therapy should be  avoided. Routine use of PPSV23 vaccine is not recommended for American Indians, [place] Natives, or people younger than 65 years unless there are medical conditions that require PPSV23 vaccine. When indicated, people who have unknown immunization and have no record of immunization should receive PPSV23 vaccine. One-time revaccination 5 years after the first dose of PPSV23 is recommended for people aged 21 64 years who have chronic kidney failure, nephrotic syndrome, asplenia, or immunocompromised conditions. People who received 1 2 doses of PPSV23 before age 63 years should receive another dose of PPSV23 vaccine at age 3 years or later if at least 5 years have passed since the previous dose. Doses of PPSV23 are not needed for people immunized with PPSV23 at or after age 35 years.  Meningococcal vaccine. Adults with asplenia or persistent complement component deficiencies should receive 2 doses of quadrivalent meningococcal conjugate (MenACWY-D) vaccine. The doses should be obtained at least 2 months apart. Microbiologists working with certain meningococcal bacteria, [employer] recruits, people at risk during an outbreak, and people who travel to or live in countries with a high rate of meningitis should be immunized. A first-year college student up through age 47 years who is living in a residence hall should receive a dose if she did not receive a dose on or after her 16th birthday. Adults who have certain high-risk conditions should receive  one or more doses of vaccine.  Hepatitis A vaccine. Adults who wish to be protected from this disease, have certain high-risk conditions, work with hepatitis A-infected animals, work in hepatitis A research labs, or travel to or work in countries with a high rate of hepatitis A should be immunized. Adults who were previously unvaccinated and who anticipate close contact with an international adoptee during the first 60 days after arrival in the [place] from a country  with a high rate of hepatitis A should be immunized.  Hepatitis B vaccine.  Adults who wish to be protected from this disease, have certain high-risk conditions, may be exposed to blood or other infectious body fluids, are household contacts or sex partners of hepatitis B positive people, are clients or workers in certain care facilities, or travel to or work in countries with a high rate of hepatitis B should be immunized.  Haemophilus influenzae type b (Hib) vaccine. A previously unvaccinated person with asplenia or sickle cell disease or having a scheduled splenectomy should receive 1 dose of Hib vaccine. Regardless of previous immunization, a recipient of a hematopoietic stem cell transplant should receive a 3-dose series 6 12 months after her successful transplant. Hib vaccine is not recommended for adults with HIV infection.  Preventive Services / Frequency Ages 57 to 39years  Blood pressure check.** / Every 1 to 2 years.  Lipid and cholesterol check.** / Every 5 years beginning at age 41.  Clinical breast exam.** / Every 3 years for women in their 53s and 61s.  BRCA-related cancer risk assessment.** / For women who have family members with a BRCA-related cancer (breast, ovarian, tubal, or peritoneal cancers).  Pap test.** / Every 2 years from ages 59 through 94. Every 3 years starting at age 72 through age 7 or 52 with a history of 3 consecutive normal Pap tests.  HPV screening.** / Every 3 years from ages 42 through ages 5 to 87 with a history of 3 consecutive normal Pap tests.  Hepatitis C blood test.** / For any individual with known risks for hepatitis C.  Skin self-exam. / Monthly.  Influenza vaccine. / Every year.  Tetanus, diphtheria, and acellular pertussis (Tdap, Td) vaccine.** / Consult your health care provider. Pregnant women should receive 1 dose of Tdap vaccine during each pregnancy. 1 dose of Td every 10 years.  Varicella vaccine.** / Consult your health care  provider. Pregnant females who do not have evidence of immunity should receive the first dose after pregnancy.  HPV vaccine. / 3 doses over 6 months, if 60 and younger. The vaccine is not recommended for use in pregnant females. However, pregnancy testing is not needed before receiving a dose.  Measles, mumps, rubella (MMR) vaccine.** / You need at least 1 dose of MMR if you were born in 1957 or later. You may also need a 2nd dose. For females of childbearing age, rubella immunity should be determined. If there is no evidence of immunity, females who are not pregnant should be vaccinated. If there is no evidence of immunity, females who are pregnant should delay immunization until after pregnancy.  Pneumococcal 13-valent conjugate (PCV13) vaccine.** / Consult your health care provider.  Pneumococcal polysaccharide (PPSV23) vaccine.** / 1 to 2 doses if you smoke cigarettes or if you have certain conditions.  Meningococcal vaccine.** / 1 dose if you are age 54 to 41 years and a Market researcher living in a residence hall, or have one of several medical conditions, you need to  get vaccinated against meningococcal disease. You may also need additional booster doses.  Hepatitis A vaccine.** / Consult your health care provider.  Hepatitis B vaccine.** / Consult your health care provider.  Haemophilus influenzae type b (Hib) vaccine.** / Consult your health care provider.  Ages 48 to 64years  Blood pressure check.** / Every 1 to 2 years.  Lipid and cholesterol check.** / Every 5 years beginning at age 89 years.  Lung cancer screening. / Every year if you are aged 35 80 years and have a 30-pack-year history of smoking and currently smoke or have quit within the past 15 years. Yearly screening is stopped once you have quit smoking for at least 15 years or develop a health problem that would prevent you from having lung cancer treatment.  Clinical breast exam.** / Every year after age 7  years.  BRCA-related cancer risk assessment.** / For women who have family members with a BRCA-related cancer (breast, ovarian, tubal, or peritoneal cancers).  Mammogram.** / Every year beginning at age 71 years and continuing for as long as you are in good health. Consult with your health care provider.  Pap test.** / Every 3 years starting at age 85 years through age 39 or 21 years with a history of 3 consecutive normal Pap tests.  HPV screening.** / Every 3 years from ages 37 years through ages 79 to 8 years with a history of 3 consecutive normal Pap tests.  Fecal occult blood test (FOBT) of stool. / Every year beginning at age 36 years and continuing until age 63 years. You may not need to do this test if you get a colonoscopy every 10 years.  Flexible sigmoidoscopy or colonoscopy.** / Every 5 years for a flexible sigmoidoscopy or every 10 years for a colonoscopy beginning at age 5 years and continuing until age 16 years.  Hepatitis C blood test.** / For all people born from [birth year] through 1964 and any individual with known risks for hepatitis C.  Skin self-exam. / Monthly.  Influenza vaccine. / Every year.  Tetanus, diphtheria, and acellular pertussis (Tdap/Td) vaccine.** / Consult your health care provider. Pregnant women should receive 1 dose of Tdap vaccine during each pregnancy. 1 dose of Td every 10 years.  Varicella vaccine.** / Consult your health care provider. Pregnant females who do not have evidence of immunity should receive the first dose after pregnancy.  Zoster vaccine.** / 1 dose for adults aged 44 years or older.  Measles, mumps, rubella (MMR) vaccine.** / You need at least 1 dose of MMR if you were born in 1957 or later. You may also need a 2nd dose. For females of childbearing age, rubella immunity should be determined. If there is no evidence of immunity, females who are not pregnant should be vaccinated. If there is no evidence of immunity, females who are  pregnant should delay immunization until after pregnancy.  Pneumococcal 13-valent conjugate (PCV13) vaccine.** / Consult your health care provider.  Pneumococcal polysaccharide (PPSV23) vaccine.** / 1 to 2 doses if you smoke cigarettes or if you have certain conditions.  Meningococcal vaccine.** / Consult your health care provider.  Hepatitis A vaccine.** / Consult your health care provider.  Hepatitis B vaccine.** / Consult your health care provider.  Haemophilus influenzae type b (Hib) vaccine.** / Consult your health care provider.  Ages 32 years and over  Blood pressure check.** / Every 1 to 2 years.  Lipid and cholesterol check.** / Every 5 years beginning at age 59 years.  Lung cancer screening. / Every year if you are aged 93 80 years and have a 30-pack-year history of smoking and currently smoke or have quit within the past 15 years. Yearly screening is stopped once you have quit smoking for at least 15 years or develop a health problem that would prevent you from having lung cancer treatment.  Clinical breast exam.** / Every year after age 87 years.  BRCA-related cancer risk assessment.** / For women who have family members with a BRCA-related cancer (breast, ovarian, tubal, or peritoneal cancers).  Mammogram.** / Every year beginning at age 7 years and continuing for as long as you are in good health. Consult with your health care provider.  Pap test.** / Every 3 years starting at age 59 years through age 54 or 76 years with 3 consecutive normal Pap tests. Testing can be stopped between 65 and 70 years with 3 consecutive normal Pap tests and no abnormal Pap or HPV tests in the past 10 years.  HPV screening.** / Every 3 years from ages 37 years through ages 21 or 33 years with a history of 3 consecutive normal Pap tests. Testing can be stopped between 65 and 70 years with 3 consecutive normal Pap tests and no abnormal Pap or HPV tests in the past 10 years.  Fecal occult  blood test (FOBT) of stool. / Every year beginning at age 54 years and continuing until age 25 years. You may not need to do this test if you get a colonoscopy every 10 years.  Flexible sigmoidoscopy or colonoscopy.** / Every 5 years for a flexible sigmoidoscopy or every 10 years for a colonoscopy beginning at age 6 years and continuing until age 67 years.  Hepatitis C blood test.** / For all people born from [birth year] through 1964 and any individual with known risks for hepatitis C.  Osteoporosis screening.** / A one-time screening for women ages 8 years and over and women at risk for fractures or osteoporosis.  Skin self-exam. / Monthly.  Influenza vaccine. / Every year.  Tetanus, diphtheria, and acellular pertussis (Tdap/Td) vaccine.** / 1 dose of Td every 10 years.  Varicella vaccine.** / Consult your health care provider.  Zoster vaccine.** / 1 dose for adults aged 74 years or older.  Pneumococcal 13-valent conjugate (PCV13) vaccine.** / Consult your health care provider.  Pneumococcal polysaccharide (PPSV23) vaccine.** / 1 dose for all adults aged 72 years and older.  Meningococcal vaccine.** / Consult your health care provider.  Hepatitis A vaccine.** / Consult your health care provider.  Hepatitis B vaccine.** / Consult your health care provider.  Haemophilus influenzae type b (Hib) vaccine.** / Consult your health care provider. ** Family history and personal history of risk and conditions may change your health care provider's recommendations. Document Released: 04/27/2001 Document Revised: 12/20/2012  [facility] Patient Information 2014 [place], [place].   EXERCISE AND DIET:  We recommended that you start or continue a regular exercise program for good health. Regular exercise means any activity that makes your heart beat faster and makes you sweat.  We recommend exercising at least 30 minutes per day at least 3 days a week, preferably 5.  We also recommend a diet low in fat  and sugar / carbohydrates.  Inactivity, poor dietary choices and obesity can cause diabetes, heart attack, stroke, and kidney damage, among others.     ALCOHOL AND SMOKING:  Women should limit their alcohol intake to no more than 7 drinks/beers/glasses of wine (combined, not each!) per week. Moderation  of alcohol intake to this level decreases your risk of breast cancer and liver damage.  ( And of course, no recreational drugs are part of a healthy lifestyle.)  Also, you should not be smoking at all or even being exposed to second hand smoke. Most people know smoking can cause cancer, and various heart and lung diseases, but did you know it also contributes to weakening of your bones?  Aging of your skin?  Yellowing of your teeth and nails?   CALCIUM AND VITAMIN D:  Adequate intake of calcium and Vitamin D are recommended.  The recommendations for exact amounts of these supplements seem to change often, but generally speaking 600 mg of calcium (either carbonate or citrate) and 800 units of Vitamin D per day seems prudent. Certain women may benefit from higher intake of Vitamin D.  If you are among these women, your doctor will have told you during your visit.     PAP SMEARS:  Pap smears, to check for cervical cancer or precancers,  have traditionally been done yearly, although recent scientific advances have shown that most women can have pap smears less often.  However, every woman still should have a physical exam from her gynecologist or primary care physician every year. It will include a breast check, inspection of the vulva and vagina to check for abnormal growths or skin changes, a visual exam of the cervix, and then an exam to evaluate the size and shape of the uterus and ovaries.  And after 52 years of age, a rectal exam is indicated to check for rectal cancers. We will also provide age appropriate advice regarding health maintenance, like when you should have certain vaccines, screening for  sexually transmitted diseases, bone density testing, colonoscopy, mammograms, etc.    MAMMOGRAMS:  All women over 11 years old should have a yearly mammogram. Many facilities now offer a "3D" mammogram, which may cost around $50 extra out of pocket. If possible,  we recommend you accept the option to have the 3D mammogram performed.  It both reduces the number of women who will be called back for extra views which then turn out to be normal, and it is better than the routine mammogram at detecting truly abnormal areas.     COLONOSCOPY:  Colonoscopy to screen for colon cancer is recommended for all women at age 41.  We know, you hate the idea of the prep.  We agree, BUT, having colon cancer and not knowing it is worse!!  Colon cancer so often starts as a polyp that can be seen and removed at colonscopy, which can quite literally save your life!  And if your first colonoscopy is normal and you have no family history of colon cancer, most women don't have to have it again for 10 years.  Once every ten years, you can do something that may end up saving your life, right?  We will be happy to help you get it scheduled when you are ready.  Be sure to check your insurance coverage so you understand how much it will cost.  It may be covered as a preventative service at no cost, but you should check your particular policy.

## 2018-09-05 NOTE — Progress Notes (Signed)
Impression and Recommendations:    1. Physical exam, annual   2. Laboratory tests ordered as part of a complete physical exam (CPE)   3. Need for Tdap vaccination   4. Overweight (BMI 25.0-29.9)   5. Mixed hyperlipidemia- HA's from meds   6. Diet controlled gestational diabetes mellitus (GDM) in first trimester   7. Need for shingles vaccine    - 1) Anticipatory Guidance: Discussed importance of wearing a seatbelt while driving, not texting while driving; sunscreen when outside along with yearly skin surveillance; eating a well balanced and modest diet; physical activity at least 25 minutes per day or 150 min/ week of moderate to intense activity.  2) Immunizations / Screenings / Labs:  All immunizations and screenings that patient agrees to, are up-to-date per recommendations or will be updated today.  Patient understands the needs for q 57mo dental and yearly vision screens which pt will schedule independently. Obtain CBC, CMP, HgA1c, Lipid panel, TSH and vit D when fasting if not already done recently.   3) Weight:   Discussed goal of losing even 5-10% of current body weight which would improve overall feelings of well being and improve objective health data significantly.   Improve nutrient density of diet through increasing intake of fruits and vegetables and decreasing saturated/trans fats, white flour products and refined sugar products.   4)  Meds ordered this encounter  Medications  . Zoster Vaccine Adjuvanted [facility]) injection    Sig: Inject 0.5 mLs into the muscle once for 1 dose.    Dispense:  0.5 mL    Refill:  0    Orders Placed This Encounter  Procedures  . Tdap vaccine greater than or equal to 7yo IM    Gross side effects, risk and benefits, and alternatives of medications discussed with patient.  Patient is aware that all medications have potential side effects and we are unable to predict every side effect or drug-drug interaction that may occur.   Expresses verbal understanding and consents to current therapy plan and treatment regimen.  F-up preventative CPE in 1 year.  F/up sooner for chronic care management - in 27mo- FLP and ov as discussed and/or prn.  Please see orders placed and AVS handed out to patient at the end of our visit for further patient instructions/ counseling done pertaining to today's office visit.   Mellody Dance, DO 10:07 AM     Subjective:    Chief Complaint  Patient presents with  . Annual Exam    HPI: Candice Newton is a 53 y.o. female who presents to [place] [facility] today a yearly health maintenance exam.  Health Maintenance Summary Reviewed and updated, unless pt declines services.  Colonoscopy:    Last done 12/2017 was negative repeat every 10 years. Tobacco History Reviewed:   Y; never has CT scan for screening lung CA:   Does not meet criteria Abdominal Ultrasound:    Does not meet criteria Alcohol:    No concerns, no excessive use Exercise Habits: Patient is meeting AHA guidelines-aerobic activity 30 minutes every day STD concerns:   None- same partner 30 yrs.  Drug Use:   None Birth control method:   IUD Menses regular:     IUD Lumps or breast concerns:      No; monthly breast exams Breast Cancer Family History:      No Bone/ DEXA scan:   Does not meet guidelines  Immunization History  Administered Date(s) Administered  .  Tdap 08/06/2008, 09/05/2018    Health Maintenance  Topic Date Due  . PAP SMEAR-Modifier  09/13/2018 (Originally 02/07/2014)  . MAMMOGRAM  09/05/2019 (Originally 07/02/2018)  . HIV Screening  09/06/2028 (Originally 06/25/1981)  . INFLUENZA VACCINE  10/14/2018  . COLONOSCOPY  12/16/2027  . TETANUS/TDAP  09/04/2028     Wt Readings from Last 3 Encounters:  09/13/18 173 lb 6.4 oz (78.7 kg)  09/05/18 175 lb 9.6 oz (79.7 kg)  10/17/17 171 lb 2 oz (77.6 kg)   BP Readings from Last 3 Encounters:  09/13/18 (!) 141/94  09/05/18 126/84   12/15/17 110/88   Pulse Readings from Last 3 Encounters:  09/13/18 81  09/05/18 80  12/15/17 77     Past Medical History:  Diagnosis Date  . Breast mass    right  . Hyperlipidemia       Past Surgical History:  Procedure Laterality Date  . APPENDECTOMY    . BREAST LUMPECTOMY WITH RADIOACTIVE SEED LOCALIZATION Right 04/07/2015   Procedure: RIGHT BREAST LUMPECTOMY WITH RADIOACTIVE SEED LOCALIZATION;  Surgeon: Fanny Skates, MD;  Location: [place];  Service: General;  Laterality: Right;  . BUNIONECTOMY Left   . TONSILLECTOMY        Family History  Problem Relation Age of Onset  . Cancer Mother        lung  . Cancer Father        lung  . Diabetes Brother   . Hyperlipidemia Brother   . Thyroid disease Brother   . Esophageal cancer Brother        88 dx esophageal ca- mets from Lung ca  . Heart disease Maternal Grandmother   . Colon cancer Neg Hx   . Rectal cancer Neg Hx   . Stomach cancer Neg Hx       Social History   Substance and Sexual Activity  Drug Use No  ,   Social History   Substance and Sexual Activity  Alcohol Use Yes   Comment: social  ,   Social History   Tobacco Use  Smoking Status Never Smoker  Smokeless Tobacco Never Used  ,   Social History   Substance and Sexual Activity  Sexual Activity Yes  . Birth control/protection: I.U.D.    Current Outpatient Medications on File Prior to Visit  Medication Sig Dispense Refill  . Multiple Vitamin (MULTIVITAMIN) tablet Take 1 tablet by mouth daily.     Current Facility-Administered Medications on File Prior to Visit  Medication Dose Route Frequency Provider Last Rate Last Dose  . 0.9 %  sodium chloride infusion  500 mL Intravenous Once Doran Stabler, MD        Allergies: Penicillins  Review of Systems: General:   Denies fever, chills, unexplained weight loss.  Optho/Auditory:   Denies visual changes, blurred vision/LOV Respiratory:   Denies SOB, DOE more than  baseline levels.  Cardiovascular:   Denies chest pain, palpitations, new onset peripheral edema  Gastrointestinal:   Denies nausea, vomiting, diarrhea.  Genitourinary: Denies dysuria, freq/ urgency, flank pain or discharge from genitals.  Endocrine:     Denies hot or cold intolerance, polyuria, polydipsia. Musculoskeletal:   Denies unexplained myalgias, joint swelling, unexplained arthralgias, gait problems.  Skin:  Denies rash, suspicious lesions Neurological:     Denies dizziness, unexplained weakness, numbness  Psychiatric/Behavioral:   Denies mood changes, suicidal or homicidal ideations, hallucinations    Objective:    Blood pressure 126/84, pulse 80, temperature 99 F (37.2 C), resp.  rate (!) 97, height 5\' 7"  (1.702 m), weight 175 lb 9.6 oz (79.7 kg). Body mass index is 27.5 kg/m. General Appearance:    Alert, cooperative, no distress, appears stated age  Head:    Normocephalic, without obvious abnormality, atraumatic  Eyes:    PERRL, conjunctiva/corneas clear, EOM's intact, fundi    benign, both eyes  Ears:    Normal TM's and external ear canals, both ears  Nose:   Nares normal, septum midline, mucosa normal, no drainage    or sinus tenderness  Throat:   Lips w/o lesion, mucosa moist, and tongue normal; teeth and   gums normal  Neck:   Supple, symmetrical, trachea midline, no adenopathy;    thyroid:  no enlargement/tenderness/nodules; no carotid   bruit or JVD  Back:     Symmetric, no curvature, ROM normal, no CVA tenderness  Lungs:     Clear to auscultation bilaterally, respirations unlabored, no       Wh/ R/ R  Chest Wall:    No tenderness or gross deformity; normal excursion   Heart:    Regular rate and rhythm, S1 and S2 normal, no murmur, rub   or gallop  Breast Exam:    Deferred by pt  Abdomen:     Soft, non-tender, bowel sounds active all four quadrants, NO   G/R/R, no masses, no organomegaly  Genitalia:    deferred  Rectal:    Not done  Extremities:   Extremities  normal, atraumatic, no cyanosis or gross edema  Pulses:   2+ and symmetric all extremities  Skin:   Warm, dry, Skin color, texture, turgor normal, no obvious rashes or lesions Psych: No HI/SI, judgement and insight good, Euthymic mood. Full Affect.  Neurologic:   CNII-XII intact, normal strength, sensation and reflexes    Throughout

## 2018-09-11 LAB — HM PAP SMEAR: HM Pap smear: NEGATIVE

## 2018-09-11 LAB — RESULTS CONSOLE HPV: CHL HPV: NEGATIVE

## 2018-09-13 ENCOUNTER — Other Ambulatory Visit: Payer: Self-pay

## 2018-09-13 ENCOUNTER — Encounter: Payer: Self-pay | Admitting: Family Medicine

## 2018-09-13 ENCOUNTER — Ambulatory Visit (INDEPENDENT_AMBULATORY_CARE_PROVIDER_SITE_OTHER): Payer: 59 | Admitting: Family Medicine

## 2018-09-13 VITALS — BP 142/94 | HR 81 | Temp 98.3°F | Ht 67.0 in | Wt 173.4 lb

## 2018-09-13 DIAGNOSIS — R030 Elevated blood-pressure reading, without diagnosis of hypertension: Secondary | ICD-10-CM | POA: Insufficient documentation

## 2018-09-13 DIAGNOSIS — E782 Mixed hyperlipidemia: Secondary | ICD-10-CM | POA: Diagnosis not present

## 2018-09-13 DIAGNOSIS — E663 Overweight: Secondary | ICD-10-CM

## 2018-09-13 MED ORDER — ROSUVASTATIN CALCIUM 10 MG PO TABS: ORAL_TABLET | ORAL | 1 refills | Status: DC

## 2018-09-13 NOTE — Progress Notes (Signed)
Impression and Recommendations:    1. Mixed hyperlipidemia- HA's from meds   2. Overweight (BMI 25.0-29.9)   3. Elevated blood pressure, situational     Mixed hyperlipidemia- HA's from meds - Plan: Start rosuvastatin (CRESTOR) 10 MG tablet, -We will check CMP next OV in 4 to 6 weeks -Convince patient due to extensive family history of hyperlipidemia and hers being extremely high even though she had side effect to Lipitor in the past, we will start low-dose Crestor. -Dietary lifestyle modifications extensively discussed with patient.  Advised to only eat red meat no more than once or twice weekly and really cut back on the cheeses. -Exercise to goal of 30 to 60 minutes daily. -And must drink water one half of her weight in ounces water per day or more  Overweight (BMI 25.0-29.9) - Plan -Extensive discussion regarding using lose it app, tracking foods, also looking at saturated trans-fat intake.  We discussed intermittent fasting versus keto diets versus other diets on the market. -Patient desires just to track intake on lose it app -Her goal is to lose 5% of her weight by next office visit.  Elevated blood pressure, situational - Plan:  -Patient states was under stress this morning.  Daughter broke her foot and has an 49-month-old at home. -She will buy a blood pressure cuff monitor for her upper arm and check at home multiple times a day if she has any symptoms of lightheadedness or headache, tingling in her extremities etc. -She also will check on a regular basis and write it down.  She will bring in her blood pressures as well as pulses in the near future in 4 weeks for follow-up. -We discussed pathophysiology of her hypertension today and what msakes it go up/ why her blood pressure could be raised   Meds ordered this encounter  Medications  . rosuvastatin (CRESTOR) 10 MG tablet    Sig: Take 0.5 tablets (5 mg total) by mouth at bedtime for 8 days, THEN 1 tablet (10 mg total)  at bedtime for 8 days.    Dispense:  90 tablet    Refill:  1    Gross side effects, risk and benefits, and alternatives of medications and treatment plan in general discussed with patient.  Patient is aware that all medications have potential side effects and we are unable to predict every side effect or drug-drug interaction that may occur.   Patient will call with any questions prior to using medication if they have concerns.    Expresses verbal understanding and consents to current therapy and treatment regimen.  No barriers to understanding were identified.  Red flag symptoms and signs discussed in detail.  Patient expressed understanding regarding what to do in case of emergency\urgent symptoms  Please see AVS handed out to patient at the end of our visit for further patient instructions/ counseling done pertaining to today's office visit.   Return for 2) 4 wks- wt loss-goal 8lb, started new chol med, elevated Bp f/up-reck CMP next OV.     Note:  This note was prepared with assistance of Dragon voice recognition software. Occasional wrong-word or sound-a-like substitutions may have occurred due to the inherent limitations of voice recognition software.   Diego Cory Emiko Osorto DO, 09/13/2018 10:41 AM   --------------------------------------------------------------------------------------------------------------------------------------------------------------------------------------------------------------------------    Subjective:     HPI: Candice Newton is a 53 y.o. female who presents to [place] at [facility] today for issues as discussed below.  Pt would like cholesterol as well as weight loss.  CHol;  lipitor took for one week.  Caused HA terribly- could not stand it. Tried nothing else.   Bad fam hx of chol, loves her meats/ beef and cheese. Pretty healthy lifestyle now.   Wt loss:  Lose it app- tried it a little.   Difficult to do evey day.  Was doing  intermittent fasting.   Elevated blood pressure today: Patient historically has had low blood pressure.  She was under a lot of stress this morning.  She occasionally has some tingling in her extremities and does not know what it is from.  She denies any visual changes, headaches, chest pain, shortness of breath or other.  She has no other focal neurological symptoms.  She is never had a history of hypertension but does have family history.   Wt Readings from Last 3 Encounters:  09/13/18 173 lb 6.4 oz (78.7 kg)  09/05/18 175 lb 9.6 oz (79.7 kg)  10/17/17 171 lb 2 oz (77.6 kg)   BP Readings from Last 3 Encounters:  09/13/18 (!) 142/94  09/05/18 126/84  12/15/17 110/88   Pulse Readings from Last 3 Encounters:  09/13/18 81  09/05/18 80  12/15/17 77   BMI Readings from Last 3 Encounters:  09/13/18 27.16 kg/m  09/05/18 27.50 kg/m  10/17/17 28.04 kg/m     Patient Care Team    Relationship Specialty Notifications Start End  Mellody Dance, DO PCP - General Family Medicine  09/06/16   Rosemary Holms, [facility] Consulting Physician Podiatry  09/06/16   Princess Bruins, MD Consulting Physician Obstetrics and Gynecology  09/06/16      Patient Active Problem List   Diagnosis Date Noted  . h/o Gestational diabetes-  with all 3 children 09/06/2016    Priority: High  . Mixed hyperlipidemia- HA's from meds 09/06/2016    Priority: High  . Family history of diabetes mellitus in brother 09/06/2016    Priority: High  . Overweight (BMI 25.0-29.9) 09/06/2016    Priority: Medium  . Breast mass, right; lumpectomy 2017 04/07/2015    Priority: Medium  . h/o Vitamin D deficiency 09/06/2016    Priority: Low  . Seasonal allergies 09/06/2016    Priority: Low  . Elevated blood pressure, situational 09/13/2018  . Abdominal pain, LLQ 08/15/2017  . Diarrhea 08/15/2017  . Black tarry stools 08/15/2017  . Family history of high cholesterol- brother 09/06/2016  . Family history of lung cancer-  parents due to smoking 09/06/2016  . Takes Multiple dietary supplements/ appetite suppressants  09/06/2016  . History of bunionectomy of left great toe 09/06/2016  . History of lumpectomy of right breast - benign 09/06/2016    Past Medical history, Surgical history, Family history, Social history, Allergies and Medications have been entered into the medical record, reviewed and changed as needed.    Current Meds  Medication Sig  . Multiple Vitamin (MULTIVITAMIN) tablet Take 1 tablet by mouth daily.   Current Facility-Administered Medications for the 09/13/18 encounter (Office Visit) with Mellody Dance, DO  Medication  . 0.9 %  sodium chloride infusion    Allergies:  Allergies  Allergen Reactions  . Penicillins Hives and Shortness Of Breath    hives     Review of Systems:  A fourteen system review of systems was performed and found to be positive as per HPI.   Objective:   Blood pressure (!) 142/94, pulse 81, temperature 98.3 F (36.8 C), height 5\' 7"  (1.702 m), weight  173 lb 6.4 oz (78.7 kg), SpO2 99 %. Body mass index is 27.16 kg/m. General:  Well Developed, well nourished, appropriate for stated age.  Neuro:  Alert and oriented,  extra-ocular muscles intact  HEENT:  Normocephalic, atraumatic, neck supple, no carotid bruits appreciated  Skin:  no gross rash, warm, pink. Cardiac:  RRR, S1 S2 Respiratory:  ECTA B/L and A/P, Not using accessory muscles, speaking in full sentences- unlabored. Vascular:  Ext warm, no cyanosis apprec.; cap RF less 2 sec. Psych:  No HI/SI, judgement and insight good, Euthymic mood. Full Affect.

## 2018-09-13 NOTE — Patient Instructions (Addendum)
Mindfulness meditation for eating  Omron series 3 through 5-please check your blood pressure Monday mornings, Wednesday afternoons and Friday evenings.  And also anytime you feel funny or tingly or headachy etc. Check it-write it down write down blood pressure as well as pulse and keep it in a little notebook to bring in each office visit.   -I will see you in 4 weeks with a goal of 8 pound weight loss using lose it app and tracking daily.      Guidelines for a Low Cholesterol, Low Saturated Fat Diet  Fats - Limit total intake of fats and oils. - Avoid butter, stick margarine, shortening, lard, palm and coconut oils. - Limit mayonnaise, salad dressings, gravies and sauces, unless they are homemade with low-fat ingredients. - Limit chocolate. - Choose low-fat and nonfat products, such as low-fat mayonnaise, low-fat or non-hydrogenated peanut butter, low-fat or fat-free salad dressings and nonfat gravy. - Use vegetable oil, such as canola or olive oil. - Look for margarine that does not contain trans fatty acids. - Use nuts in moderate amounts. - Read ingredient labels carefully to determine both amount and type of fat present in foods. Limit saturated and trans fats! - Avoid high-fat processed and convenience foods.  Meats and Meat Alternatives - Choose fish, chicken, [place] and lean meats. - Use dried beans, peas, lentils and tofu. - Limit egg yolks to three to four per week. - If you eat red meat, limit to no more than three servings per week and choose loin or round cuts. - Avoid fatty meats, such as bacon, sausage, franks, luncheon meats and ribs. - Avoid all organ meats, including liver.  Dairy - Choose nonfat or low-fat milk, yogurt and cottage cheese. - Most cheeses are high in fat. Choose cheeses made from non-fat milk, such as mozzarella and ricotta cheese. - Choose light or fat-free cream cheese and sour cream. - Avoid cream and sauces made with cream.  Fruits and  Vegetables - Eat a wide variety of fruits and vegetables. - Use lemon juice, vinegar or "mist" olive oil on vegetables. - Avoid adding sauces, fat or oil to vegetables.  Breads, Cereals and Grains - Choose whole-grain breads, cereals, pastas and rice. - Avoid high-fat snack foods, such as granola, cookies, pies, pastries, doughnuts and croissants.  Cooking Tips - Avoid deep fried foods. - Trim visible fat off meats and remove skin from poultry before cooking. - Bake, broil, boil, poach or roast poultry, fish and lean meats. - Drain and discard fat that drains out of meat as you cook it. - Add little or no fat to foods. - Use vegetable oil sprays to grease pans for cooking or baking. - Steam vegetables. - Use herbs or no-oil marinades to flavor foods.       Your goal blood pressure should be 130/80 or less on a regular basis, or medications should be started/ modified.    Normal blood pressure is less than 120/80.   Hypertension Hypertension, commonly called high blood pressure, is when the force of blood pumping through the arteries is too strong. The arteries are the blood vessels that carry blood from the heart throughout the body. Hypertension forces the heart to work harder to pump blood and may cause arteries to become narrow or stiff. Having untreated or uncontrolled hypertension can cause heart attacks, strokes, kidney disease, and other problems. A blood pressure reading consists of a higher number over a lower number. Ideally, your blood pressure should be below  120/80. The first ("top") number is called the systolic pressure. It is a measure of the pressure in your arteries as your heart beats. The second ("bottom") number is called the diastolic pressure. It is a measure of the pressure in your arteries as the heart relaxes. What are the causes? The cause of this condition is not known. What increases the risk? Some risk factors for high blood pressure are under your  control. Others are not. Factors you can change  Smoking.  Having type 2 diabetes mellitus, high cholesterol, or both.  Not getting enough exercise or physical activity.  Being overweight.  Having too much fat, sugar, calories, or salt (sodium) in your diet.  Drinking too much alcohol. Factors that are difficult or impossible to change  Having chronic kidney disease.  Having a family history of high blood pressure.  Age. Risk increases with age.  Race. You may be at higher risk if you are African-American.  Gender. Men are at higher risk than women before age 86. After age 67, women are at higher risk than men.  Having obstructive sleep apnea.  Stress. What are the signs or symptoms? Extremely high blood pressure (hypertensive crisis) may cause:  Headache.  Anxiety.  Shortness of breath.  Nosebleed.  Nausea and vomiting.  Severe chest pain.  Jerky movements you cannot control (seizures).  How is this diagnosed? This condition is diagnosed by measuring your blood pressure while you are seated, with your arm resting on a surface. The cuff of the blood pressure monitor will be placed directly against the skin of your upper arm at the level of your heart. It should be measured at least twice using the same arm. Certain conditions can cause a difference in blood pressure between your right and left arms. Certain factors can cause blood pressure readings to be lower or higher than normal (elevated) for a short period of time:  When your blood pressure is higher when you are in a health care provider's office than when you are at home, this is called white coat hypertension. Most people with this condition do not need medicines.  When your blood pressure is higher at home than when you are in a health care provider's office, this is called masked hypertension. Most people with this condition may need medicines to control blood pressure.  If you have a high blood  pressure reading during one visit or you have normal blood pressure with other risk factors:  You may be asked to return on a different day to have your blood pressure checked again.  You may be asked to monitor your blood pressure at home for 1 week or longer.  If you are diagnosed with hypertension, you may have other blood or imaging tests to help your health care provider understand your overall risk for other conditions. How is this treated? This condition is treated by making healthy lifestyle changes, such as eating healthy foods, exercising more, and reducing your alcohol intake. Your health care provider may prescribe medicine if lifestyle changes are not enough to get your blood pressure under control, and if:  Your systolic blood pressure is above 130.  Your diastolic blood pressure is above 80.  Your personal target blood pressure may vary depending on your medical conditions, your age, and other factors. Follow these instructions at home: Eating and drinking  Eat a diet that is high in fiber and potassium, and low in sodium, added sugar, and fat. An example eating plan is called  the DASH (Dietary Approaches to Stop Hypertension) diet. To eat this way: ? Eat plenty of fresh fruits and vegetables. Try to fill half of your plate at each meal with fruits and vegetables. ? Eat whole grains, such as whole wheat pasta, brown rice, or whole grain bread. Fill about one quarter of your plate with whole grains. ? Eat or drink low-fat dairy products, such as skim milk or low-fat yogurt. ? Avoid fatty cuts of meat, processed or cured meats, and poultry with skin. Fill about one quarter of your plate with lean proteins, such as fish, chicken without skin, beans, eggs, and tofu. ? Avoid premade and processed foods. These tend to be higher in sodium, added sugar, and fat.  Reduce your daily sodium intake. Most people with hypertension should eat less than 1,500 mg of sodium a day.  Limit  alcohol intake to no more than 1 drink a day for nonpregnant women and 2 drinks a day for men. One drink equals 12 oz of beer, 5 oz of wine, or 1 oz of hard liquor. Lifestyle  Work with your health care provider to maintain a healthy body weight or to lose weight. Ask what an ideal weight is for you.  Get at least 30 minutes of exercise that causes your heart to beat faster (aerobic exercise) most days of the week. Activities may include walking, swimming, or biking.  Include exercise to strengthen your muscles (resistance exercise), such as pilates or lifting weights, as part of your weekly exercise routine. Try to do these types of exercises for 30 minutes at least 3 days a week.  Do not use any products that contain nicotine or tobacco, such as cigarettes and e-cigarettes. If you need help quitting, ask your health care provider.  Monitor your blood pressure at home as told by your health care provider.  Keep all follow-up visits as told by your health care provider. This is important. Medicines  Take over-the-counter and prescription medicines only as told by your health care provider. Follow directions carefully. Blood pressure medicines must be taken as prescribed.  Do not skip doses of blood pressure medicine. Doing this puts you at risk for problems and can make the medicine less effective.  Ask your health care provider about side effects or reactions to medicines that you should watch for. Contact a health care provider if:  You think you are having a reaction to a medicine you are taking.  You have headaches that keep coming back (recurring).  You feel dizzy.  You have swelling in your ankles.  You have trouble with your vision. Get help right away if:  You develop a severe headache or confusion.  You have unusual weakness or numbness.  You feel faint.  You have severe pain in your chest or abdomen.  You vomit repeatedly.  You have trouble  breathing. Summary  Hypertension is when the force of blood pumping through your arteries is too strong. If this condition is not controlled, it may put you at risk for serious complications.  Your personal target blood pressure may vary depending on your medical conditions, your age, and other factors. For most people, a normal blood pressure is less than 120/80.  Hypertension is treated with lifestyle changes, medicines, or a combination of both. Lifestyle changes include weight loss, eating a healthy, low-sodium diet, exercising more, and limiting alcohol. This information is not intended to replace advice given to you by your health care provider. Make sure you discuss any  questions you have with your health care provider. Document Released: 03/01/2005 Document Revised: 01/28/2016 Document Reviewed: 01/28/2016 Elsevier Interactive Patient Education  2018 [employer].    How to Take Your Blood Pressure   Blood pressure is a measurement of how strongly your blood is pressing against the walls of your arteries. Arteries are blood vessels that carry blood from your heart throughout your body. Your health care provider takes your blood pressure at each office visit. You can also take your own blood pressure at home with a blood pressure machine. You may need to take your own blood pressure:  To confirm a diagnosis of high blood pressure (hypertension).  To monitor your blood pressure over time.  To make sure your blood pressure medicine is working.  Supplies needed: To take your blood pressure, you will need a blood pressure machine. You can buy a blood pressure machine, or blood pressure monitor, at most drugstores or online. There are several types of home blood pressure monitors. When choosing one, consider the following:  Choose a monitor that has an arm cuff.  Choose a monitor that wraps snugly around your upper arm. You should be able to fit only one finger between your arm and  the cuff.  Do not choose a monitor that measures your blood pressure from your wrist or finger.  Your health care provider can suggest a reliable monitor that will meet your needs. How to prepare To get the most accurate reading, avoid the following for 30 minutes before you check your blood pressure:  Drinking caffeine.  Drinking alcohol.  Eating.  Smoking.  Exercising.  Five minutes before you check your blood pressure:  Empty your bladder.  Sit quietly without talking in a dining chair, rather than in a soft couch or armchair.  How to take your blood pressure To check your blood pressure, follow the instructions in the manual that came with your blood pressure monitor. If you have a digital blood pressure monitor, the instructions may be as follows: 1. Sit up straight. 2. Place your feet on the floor. Do not cross your ankles or legs. 3. Rest your left arm at the level of your heart on a table or desk or on the arm of a chair. 4. Pull up your shirt sleeve. 5. Wrap the blood pressure cuff around the upper part of your left arm, 1 inch (2.5 cm) above your elbow. It is best to wrap the cuff around bare skin. 6. Fit the cuff snugly around your arm. You should be able to place only one finger between the cuff and your arm. 7. Position the cord inside the groove of your elbow. 8. Press the power button. 9. Sit quietly while the cuff inflates and deflates. 10. Read the digital reading on the monitor screen and write it down (record it). 11. Wait 2-3 minutes, then repeat the steps, starting at step 1.  What does my blood pressure reading mean? A blood pressure reading consists of a higher number over a lower number. Ideally, your blood pressure should be below 120/80. The first ("top") number is called the systolic pressure. It is a measure of the pressure in your arteries as your heart beats. The second ("bottom") number is called the diastolic pressure. It is a measure of the  pressure in your arteries as the heart relaxes. Blood pressure is classified into four stages. The following are the stages for adults who do not have a short-term serious illness or a chronic condition.  Systolic pressure and diastolic pressure are measured in a unit called mm Hg. Normal  Systolic pressure: below 952.  Diastolic pressure: below 80. Elevated  Systolic pressure: 841-324.  Diastolic pressure: below 80. Hypertension stage 1  Systolic pressure: 401-027.  Diastolic pressure: 25-36. Hypertension stage 2  Systolic pressure: 644 or above.  Diastolic pressure: 90 or above. You can have prehypertension or hypertension even if only the systolic or only the diastolic number in your reading is higher than normal. Follow these instructions at home:  Check your blood pressure as often as recommended by your health care provider.  Take your monitor to the next appointment with your health care provider to make sure: ? That you are using it correctly. ? That it provides accurate readings.  Be sure you understand what your goal blood pressure numbers are.  Tell your health care provider if you are having any side effects from blood pressure medicine. Contact a health care provider if:  Your blood pressure is consistently high. Get help right away if:  Your systolic blood pressure is higher than 180.  Your diastolic blood pressure is higher than 110. This information is not intended to replace advice given to you by your health care provider. Make sure you discuss any questions you have with your health care provider. Document Released: 08/08/2015 Document Revised: 10/21/2015 Document Reviewed: 08/08/2015 Elsevier Interactive Patient Education  [employer].

## 2018-10-12 ENCOUNTER — Ambulatory Visit: Payer: 59 | Admitting: Family Medicine

## 2018-10-16 ENCOUNTER — Ambulatory Visit: Payer: 59

## 2018-10-24 ENCOUNTER — Other Ambulatory Visit: Payer: Self-pay

## 2018-10-24 ENCOUNTER — Telehealth: Payer: Self-pay | Admitting: Family Medicine

## 2018-10-24 ENCOUNTER — Encounter: Payer: Self-pay | Admitting: Family Medicine

## 2018-10-24 DIAGNOSIS — E782 Mixed hyperlipidemia: Secondary | ICD-10-CM

## 2018-10-24 MED ORDER — ROSUVASTATIN CALCIUM 10 MG PO TABS: 10.0000 mg | ORAL_TABLET | Freq: Every day | ORAL | 0 refills | Status: DC

## 2018-10-24 NOTE — Telephone Encounter (Signed)
Patient called states she is in [place] till 8/22 & forward got her medicine (Crestor) and is feeling withdrawal symptoms.  --Patient request provider send Rx : Just enough to cover her till she returns home on 8/22   rosuvastatin (CRESTOR) 10 MG tablet [[id number]] ENDED  Order Details Dose, Route, Frequency: As Directed  Dispense Quantity: 90 tablet Refills: 1 Fills remaining: --        Sig: Take 0.5 tablets (5 mg total) by mouth at bedtime for 8 days, THEN 1 tablet (10 mg total) at bedtime for 8 days.          Forwarding patient request to medical assistant that if approved to send Refill order to:  [facility] in [place], [place]  Address: [address] [place] [address]   Open ? Closes 5:30PM                          Phone: [phone]  ----Please call patient @ cell [phone] or use Mychart to contact her with decision --glh

## 2018-10-24 NOTE — Telephone Encounter (Signed)
Sent in 15 days - patient notified. MPulliam, CMA/RT(R)

## 2018-12-06 ENCOUNTER — Other Ambulatory Visit: Payer: 59

## 2018-12-08 ENCOUNTER — Other Ambulatory Visit: Payer: Self-pay

## 2018-12-08 ENCOUNTER — Other Ambulatory Visit (INDEPENDENT_AMBULATORY_CARE_PROVIDER_SITE_OTHER): Payer: 59

## 2018-12-08 DIAGNOSIS — E782 Mixed hyperlipidemia: Secondary | ICD-10-CM

## 2018-12-09 LAB — COMPREHENSIVE METABOLIC PANEL
ALT: 28 IU/L (ref 0–32)
AST: 17 IU/L (ref 0–40)
Albumin/Globulin Ratio: 2 (ref 1.2–2.2)
Albumin: 4.4 g/dL (ref 3.8–4.9)
Alkaline Phosphatase: 84 IU/L (ref 39–117)
BUN/Creatinine Ratio: 19 (ref 9–23)
BUN: 14 mg/dL (ref 6–24)
Bilirubin Total: 0.7 mg/dL (ref 0.0–1.2)
CO2: 24 mmol/L (ref 20–29)
Calcium: 9.4 mg/dL (ref 8.7–10.2)
Chloride: 104 mmol/L (ref 96–106)
Creatinine, Ser: 0.75 mg/dL (ref 0.57–1.00)
GFR calc Af Amer: 106 mL/min/{1.73_m2} (ref >59)
GFR calc non Af Amer: 92 mL/min/{1.73_m2} (ref >59)
Globulin, Total: 2.2 g/dL (ref 1.5–4.5)
Glucose: 102 mg/dL — ABNORMAL HIGH (ref 65–99)
Potassium: 4.5 mmol/L (ref 3.5–5.2)
Sodium: 141 mmol/L (ref 134–144)
Total Protein: 6.6 g/dL (ref 6.0–8.5)

## 2018-12-11 ENCOUNTER — Encounter: Payer: Self-pay | Admitting: Family Medicine

## 2018-12-11 ENCOUNTER — Ambulatory Visit (INDEPENDENT_AMBULATORY_CARE_PROVIDER_SITE_OTHER): Payer: 59 | Admitting: Family Medicine

## 2018-12-11 ENCOUNTER — Other Ambulatory Visit: Payer: Self-pay

## 2018-12-11 VITALS — BP 113/78 | HR 91 | Ht 67.0 in | Wt 172.0 lb

## 2018-12-11 DIAGNOSIS — E663 Overweight: Secondary | ICD-10-CM

## 2018-12-11 DIAGNOSIS — E782 Mixed hyperlipidemia: Secondary | ICD-10-CM | POA: Diagnosis not present

## 2018-12-11 DIAGNOSIS — R030 Elevated blood-pressure reading, without diagnosis of hypertension: Secondary | ICD-10-CM

## 2018-12-11 DIAGNOSIS — Z8342 Family history of familial hypercholesterolemia: Secondary | ICD-10-CM | POA: Diagnosis not present

## 2018-12-11 DIAGNOSIS — Z833 Family history of diabetes mellitus: Secondary | ICD-10-CM

## 2018-12-11 MED ORDER — ROSUVASTATIN CALCIUM 10 MG PO TABS: 10.0000 mg | ORAL_TABLET | Freq: Every day | ORAL | 1 refills | Status: DC

## 2018-12-11 NOTE — Patient Instructions (Signed)
Behavior Modification Ideas for Weight Management  Weight management involves adopting a healthy lifestyle that includes a knowledge of nutrition and exercise, a positive attitude and the right kind of motivation. Internal motives such as better health, increased energy, self-esteem and personal control increase your chances of lifelong weight management success.  Remember to have realistic goals and think long-term success. Believe in yourself and you can do it. The following information will give you ideas to help you meet your goals.  Control Your Home Environment  Eat only while sitting down at the kitchen or dining room table. Do not eat while watching television, reading, cooking, talking on the phone, standing at the refrigerator or working on the computer. Keep tempting foods out of the house - don't buy them. Keep tempting foods out of sight. Have low-calorie foods ready to eat. Unless you are preparing a meal, stay out of the kitchen. Have healthy snacks at your disposal, such as small pieces of fruit, vegetables, canned fruit, pretzels, low-fat string cheese and nonfat cottage cheese.  Control Your Work Environment  Do not eat at your desk or keep tempting snacks at your desk. If you get hungry between meals, plan healthy snacks and bring them with you to work. During your breaks, go for a walk instead of eating. If you work around food, plan in advance the one item you will eat at mealtime. Make it inconvenient to nibble on food by chewing gum, sugarless candy or drinking water or another low-calorie beverage. Do not work through meals. Skipping meals slows down metabolism and may result in overeating at the next meal. If food is available for special occasions, either pick the healthiest item, nibble on low-fat snacks brought from home, don't have anything offered, choose one option and have a small amount, or have only a beverage.  Control Your Mealtime Environment  Serve your  plate of food at the stove or kitchen counter. Do not put the serving dishes on the table. If you do put dishes on the table, remove them immediately when finished eating. Fill half of your plate with vegetables, a quarter with lean protein and a quarter with starch. Use smaller plates, bowls and glasses. A smaller portion will look large when it is in a little dish. Politely refuse second helpings. When fixing your plate, limit portions of food to one scoop/serving or less.   Daily Food Management  Replace eating with another activity that you will not associate with food. Wait 20 minutes before eating something you are craving. Drink a large glass of water or diet soda before eating. Always have a big glass or bottle of water to drink throughout the day. Avoid high-calorie add-ons such as cream with your coffee, butter, mayonnaise and salad dressings.  Shopping: Do not shop when hungry or tired. Shop from a list and avoid buying anything that is not on your list. If you must have tempting foods, buy individual-sized packages and try to find a lower-calorie alternative. Don't taste test in the store. Read food labels. Compare products to help you make the healthiest choices.  Preparation: Chew a piece of gum while cooking meals. Use a quarter teaspoon if you taste test your food. Try to only fix what you are going to eat, leaving yourself no chance for seconds. If you have prepared more food than you need, portion it into individual containers and freeze or refrigerate immediately. Don't snack while cooking meals.  Eating: Eat slowly. Remember it takes about 20 minutes   for your stomach to send a message to your brain that it is full. Don't let fake hunger make you think you need more. The ideal way to eat is to take a bite, put your utensil down, take a sip of water, cut your next bite, take a bit, put your utensil down and so on. Do not cut your food all at one time. Cut only as  needed. Take small bites and chew your food well. Stop eating for a minute or two at least once during a meal or snack. Take breaks to reflect and have conversation.  Cleanup and Leftovers: Label leftovers for a specific meal or snack. Freeze or refrigerate individual portions of leftovers. Do not clean up if you are still hungry.  Eating Out and Social Eating  Do not arrive hungry. Eat something light before the meal. Try to fill up on low-calorie foods, such as vegetables and fruit, and eat smaller portions of the high-calorie foods. Eat foods that you like, but choose small portions. If you want seconds, wait at least 20 minutes after you have eaten to see if you are actually hungry or if your eyes are bigger than your stomach. Limit alcoholic beverages. Try a soda water with a twist of lime. Do not skip other meals in the day to save room for the special event.  At Restaurants: Order  la carte rather than buffet style. Order some vegetables or a salad for an appetizer instead of eating bread. If you order a high-calorie dish, share it with someone. Try an after-dinner mint with your coffee. If you do have dessert, share it with two or more people. Don't overeat because you do not want to waste food. Ask for a doggie bag to take extra food home. Tell the server to put half of your entree in a to go bag before the meal is served to you. Ask for salad dressing, gravy or high-fat sauces on the side. Dip the tip of your fork in the dressing before each bite. If bread is served, ask for only one piece. Try it plain without butter or oil. At Italian restaurants where oil and vinegar is served with bread, use only a small amount of oil and a lot of vinegar for dipping.  At a Friend's House: Offer to bring a dish, appetizer or dessert that is low in calories. Serve yourself small portions or tell the host that you only want a small amount. Stand or sit away from the snack table. Stay away  from the kitchen or stay busy if you are near the food. Limit your alcohol intake.  At Buffets and Cafeterias: Cover most of your plate with lettuce and/or vegetables. Use a salad plate instead of a dinner plate. After eating, clear away your dishes before having coffee or tea.  Entertaining at Home: Explore low-fat, low-cholesterol cookbooks. Use single-serving foods like chicken breasts or hamburger patties. Prepare low-calorie appetizers and desserts.   Holidays: Keep tempting foods out of sight. Decorate the house without using food. Have low-calorie beverages and foods on hand for guests. Allow yourself one planned treat a day. Don't skip meals to save up for the holiday feast. Eat regular, planned meals.   Exercise Well  Make exercise a priority and a planned activity in the day. If possible, walk the entire or part of the distance to work. Get an exercise buddy. Go for a walk with a colleague during one of your breaks, go to the gym, run or   take a walk with a friend, walk in the mall with a shopping companion. Park at the end of the parking lot and walk to the store or office entrance. Always take the stairs all of the way or at least part of the way to your floor. If you have a desk job, walk around the office frequently. Do leg lifts while sitting at your desk. Do something outside on the weekends like going for a hike or a bike ride.   Have a Healthy Attitude  Make health your weight management priority. Be realistic. Have a goal to achieve a healthier you, not necessarily the lowest weight or ideal weight based on calculations or tables. Focus on a healthy eating style, not on dieting. Dieting usually lasts for a short amount of time and rarely produces long-term success. Think long term. You are developing new healthy behaviors to follow next month, in a year and in a decade.    This information is for educational purposes only and is not intended to replace the  advice of your doctor or health care provider. We encourage you to discuss with your doctor any questions or concerns you may have.        Guidelines for Losing Weight   We want weight loss that will last so you should lose 1-2 pounds a week.  THAT IS IT! Please pick THREE things a month to change. Once it is a habit check off the item. Then pick another three items off the list to become habits.  If you are already doing a habit on the list GREAT!  Cross that item off!  Don't drink your calories. Ie, alcohol, soda, fruit juice, and sweet tea.   Drink more water. Drink a glass when you feel hungry or before each meal.   Eat breakfast - Complex carb and protein (likeDannon light and fit yogurt, oatmeal, fruit, eggs, turkey bacon).  Measure your cereal.  Eat no more than one cup a day. (ie Kashi)  Eat an apple a day.  Add a vegetable a day.  Try a new vegetable a month.  Use Pam! Stop using oil or butter to cook.  Don't finish your plate or use smaller plates.  Share your dessert.  Eat sugar free Jello for dessert or frozen grapes.  Don't eat 2-3 hours before bed.  Switch to whole wheat bread, pasta, and brown rice.  Make healthier choices when you eat out. No fries!  Pick baked chicken, NOT fried.  Don't forget to SLOW DOWN when you eat. It is not going anywhere.   Take the stairs.  Park far away in the parking lot  Lift soup cans (or weights) for 10 minutes while watching TV.  Walk at work for 10 minutes during break.  Walk outside 1 time a week with your friend, kids, dog, or significant other.  Start a walking group at church.  Walk the mall as much as you can tolerate.   Keep a food diary.  Weigh yourself daily.  Walk for 15 minutes 3 days per week.  Cook at home more often and eat out less. If life happens and you go back to old habits, it is okay.  Just start over. You can do it!  If you experience chest pain, get short of breath, or tired  during the exercise, please stop immediately and inform your doctor.    Before you even begin to attack a weight-loss plan, it pays to remember this: You are not   fat. You have fat. Losing weight isn't about blame or shame; it's simply another achievement to accomplish. Dieting is like any other skill-you have to buckle down and work at it. As long as you act in a smart, reasonable way, you'll ultimately get where you want to be. Here are some weight loss pearls for you.   1. It's Not a Diet. It's a Lifestyle Thinking of a diet as something you're on and suffering through only for the short term doesn't work. To shed weight and keep it off, you need to make permanent changes to the way you eat. It's OK to indulge occasionally, of course, but if you cut calories temporarily and then revert to your old way of eating, you'll gain back the weight quicker than you can say yo-yo. Use it to lose it. Research shows that one of the best predictors of long-term weight loss is how many pounds you drop in the first month. For that reason, nutritionists often suggest being stricter for the first two weeks of your new eating strategy to build momentum. Cut out added sugar and alcohol and avoid unrefined carbs. After that, figure out how you can reincorporate them in a way that's healthy and maintainable.  2. There's a Right Way to Exercise Working out burns calories and fat and boosts your metabolism by building muscle. But those trying to lose weight are notorious for overestimating the number of calories they burn and underestimating the amount they take in. Unfortunately, your system is biologically programmed to hold on to extra pounds and that means when you start exercising, your body senses the deficit and ramps up its hunger signals. If you're not diligent, you'll eat everything you burn and then some. Use it, to lose it. Cardio gets all the exercise glory, but strength and interval training are the real heroes.  They help you build lean muscle, which in turn increases your metabolism and calorie-burning ability 3. Don't Overreact to Mild Hunger Some people have a hard time losing weight because of hunger anxiety. To them, being hungry is bad-something to be avoided at all costs-so they carry snacks with them and eat when they don't need to. Others eat because they're stressed out or bored. While you never want to get to the point of being ravenous (that's when bingeing is likely to happen), a hunger pang, a craving, or the fact that it's 3:00 p.m. should not send you racing for the vending machine or obsessing about the energy bar in your purse. Ideally, you should put off eating until your stomach is growling and it's difficult to concentrate.  Use it to lose it. When you feel the urge to eat, use the HALT method. Ask yourself, Am I really hungry? Or am I angry or anxious, lonely or bored, or tired? If you're still not certain, try the apple test. If you're truly hungry, an apple should seem delicious; if it doesn't, something else is going on. Or you can try drinking water and making yourself busy, if you are still hungry try a healthy snack.  4. Not All Calories Are Created Equal The mechanics of weight loss are pretty simple: Take in fewer calories than you use for energy. But the kind of food you eat makes all the difference. Processed food that's high in saturated fat and refined starch or sugar can cause inflammation that disrupts the hormone signals that tell your brain you're full. The result: You eat a lot more.  Use it to lose   it. Clean up your diet. Swap in whole, unprocessed foods, including vegetables, lean protein, and healthy fats that will fill you up and give you the biggest nutritional bang for your calorie buck. In a few weeks, as your brain starts receiving regular hunger and fullness signals once again, you'll notice that you feel less hungry overall and naturally start cutting back on the amount  you eat.  5. Protein, Produce, and Plant-Based Fats Are Your Weight-Loss Trinity Here's why eating the three Ps regularly will help you drop pounds. Protein fills you up. You need it to build lean muscle, which keeps your metabolism humming so that you can torch more fat. People in a weight-loss program who ate double the recommended daily allowance for protein (about 110 grams for a 150-pound woman) lost 70 percent of their weight from fat, while people who ate the RDA lost only about 40 percent, one study found. Produce is packed with filling fiber. "It's very difficult to consume too many calories if you're eating a lot of vegetables. Example: Three cups of broccoli is a lot of food, yet only 93 calories. (Fruit is another story. It can be easy to overeat and can contain a lot of calories from sugar, so be sure to monitor your intake.) Plant-based fats like olive oil and those in avocados and nuts are healthy and extra satiating.  Use it to lose it. Aim to incorporate each of the three Ps into every meal and snack. People who eat protein throughout the day are able to keep weight off, according to a study in the American Journal of Clinical Nutrition. In addition to meat, poultry and seafood, good sources are beans, lentils, eggs, tofu, and yogurt. As for fat, keep portion sizes in check by measuring out salad dressing, oil, and nut butters (shoot for one to two tablespoons). Finally, eat veggies or a little fruit at every meal. People who did that consumed 308 fewer calories but didn't feel any hungrier than when they didn't eat more produce.  7. How You Eat Is As Important As What You Eat In order for your brain to register that you're full, you need to focus on what you're eating. Sit down whenever you eat, preferably at a table. Turn off the TV or computer, put down your phone, and look at your food. Smell it. Chew slowly, and don't put another bite on your fork until you swallow. When women ate  lunch this attentively, they consumed 30 percent less when snacking later than those who listened to an audiobook at lunchtime, according to a study in the [place] Journal of Nutrition. 8. Weighing Yourself Really Works The scale provides the best evidence about whether your efforts are paying off. Seeing the numbers tick up or down or stagnate is motivation to keep going-or to rethink your approach. A 2015 study at [employer] found that daily weigh-ins helped people lose more weight, keep it off, and maintain that loss, even after two years. Use it to lose it. Step on the scale at the same time every day for the best results. If your weight shoots up several pounds from one weigh-in to the next, don't freak out. Eating a lot of salt the night before or having your period is the likely culprit. The number should return to normal in a day or two. It's a steady climb that you need to do something about. 9. Too Much Stress and Too Little Sleep Are Your Enemies When you're tired and frazzled,   your body cranks up the production of cortisol, the stress hormone that can cause carb cravings. Not getting enough sleep also boosts your levels of ghrelin, a hormone associated with hunger, while suppressing leptin, a hormone that signals fullness and satiety. People on a diet who slept only five and a half hours a night for two weeks lost 55 percent less fat and were hungrier than those who slept eight and a half hours, according to a study in the [employer] Journal. Use it to lose it. Prioritize sleep, aiming for seven hours or more a night, which research shows helps lower stress. And make sure you're getting quality zzz's. If a snoring spouse or a fidgety cat wakes you up frequently throughout the night, you may end up getting the equivalent of just four hours of sleep, according to a study from [employer]. Keep pets out of the bedroom, and use a white-noise app to drown out  snoring. 10. You Will Hit a plateau-And You Can Bust Through It As you slim down, your body releases much less leptin, the fullness hormone.  If you're not strength training, start right now. Building muscle can raise your metabolism to help you overcome a plateau. To keep your body challenged and burning calories, incorporate new moves and more intense intervals into your workouts or add another sweat session to your weekly routine. Alternatively, cut an extra 100 calories or so a day from your diet. Now that you've lost weight, your body simply doesn't need as much fuel.    Since food equals calories, in order to lose weight you must either eat fewer calories, exercise more to burn off calories with activity, or both. Food that is not used to fuel the body is stored as fat. A major component of losing weight is to make smarter food choices. Here's how:  1)   Limit non-nutritious foods, such as: Sugar, honey, syrups and candy Pastries, donuts, pies, cakes and cookies Soft drinks, sweetened juices and alcoholic beverages  2)  Cut down on high-fat foods by: - Choosing poultry, fish or lean red meat - Choosing low-fat cooking methods, such as baking, broiling, steaming, grilling and boiling - Using low-fat or non-fat dairy products - Using vinaigrette, herbs, lemon or fat-free salad dressings - Avoiding fatty meats, such as bacon, sausage, franks, ribs and luncheon meats - Avoiding high-fat snacks like nuts, chips and chocolate - Avoiding fried foods - Using less butter, margarine, oil and mayonnaise - Avoiding high-fat gravies, cream sauces and cream-based soups  3) Eat a variety of foods, including: - Fruit and vegetables that are raw, steamed or baked - Whole grains, breads, cereal, rice and pasta - Dairy products, such as low-fat or non-fat milk or yogurt, low-fat cottage cheese and low-fat cheese - Protein-rich foods like chicken, turkey, fish, lean meat and legumes, or beans  4)  Change your eating habits by: - Eat three balanced meals a day to help control your hunger - Watch portion sizes and eat small servings of a variety of foods - Choose low-calorie snacks - Eat only when you are hungry and stop when you are satisfied - Eat slowly and try not to perform other tasks while eating - Find other activities to distract you from food, such as walking, taking up a hobby or being involved in the community - Include regular exercise in your daily routine ( minimum of 20 min of moderate-intensity exercise at least 5 days/week)  - Find a support group,   if necessary, for emotional support in your weight loss journey           Easy ways to cut 100 calories   1. Eat your eggs with hot sauce OR salsa instead of cheese.  Eggs are great for breakfast, but many people consider eggs and cheese to be BFFs. Instead of cheese-1 oz. of cheddar has 114 calories-top your eggs with hot sauce, which contains no calories and helps with satiety and metabolism. Salsa is also a great option!!  2. Top your toast, waffles or pancakes with fresh berries instead of jelly or syrup. Half a cup of berries-fresh, frozen or thawed-has about 40 calories, compared with 2 tbsp. of maple syrup or jelly, which both have about 100 calories. The berries will also give you a good punch of fiber, which helps keep you full and satisfied and won't spike blood sugar quickly like the jelly or syrup. 3. Swap the non-fat latte for black coffee with a splash of half-and-half. Contrary to its name, that non-fat latte has 130 calories and a startling 19g of carbohydrates per 16 oz. serving. Replacing that 'light' drinkable dessert with a black coffee with a splash of half-and-half saves you more than 100 calories per 16 oz. serving. 4. Sprinkle salads with freeze-dried raspberries instead of dried cranberries. If you want a sweet addition to your nutritious salad, stay away from dried cranberries. They have a  whopping 130 calories per  cup and 30g carbohydrates. Instead, sprinkle freeze-dried raspberries guilt-free and save more than 100 calories per  cup serving, adding 3g of belly-filling fiber. 5. Go for mustard in place of mayo on your sandwich. Mustard can add really nice flavor to any sandwich, and there are tons of varieties, from spicy to honey. A serving of mayo is 95 calories, versus 10 calories in a serving of mustard.  Or try an avocado mayo spread: You can find the recipe few click this link: https://www.californiaavocado.com/recipes/recipe-container/[place]ifornia-avocado-mayo 6. Choose a DIY salad dressing instead of the store-bought kind. Mix Dijon or whole grain mustard with low-fat Kefir or red wine vinegar and garlic. 7. Use hummus as a spread instead of a dip. Use hummus as a spread on a high-fiber cracker or tortilla with a sandwich and save on calories without sacrificing taste. 8. Pick just one salad "accessory." Salad isn't automatically a calorie winner. It's easy to over-accessorize with toppings. Instead of topping your salad with nuts, avocado and cranberries (all three will clock in at 313 calories), just pick one. The next day, choose a different accessory, which will also keep your salad interesting. You don't wear all your jewelry every day, right? 9. Ditch the white pasta in favor of spaghetti squash. One cup of cooked spaghetti squash has about 40 calories, compared with traditional spaghetti, which comes with more than 200. Spaghetti squash is also nutrient-dense. It's a good source of fiber and Vitamins A and C, and it can be eaten just like you would eat pasta-with a great tomato sauce and turkey meatballs or with pesto, tofu and spinach, for example. 10. Dress up your chili, soups and stews with non-fat Greek yogurt instead of sour cream. Just a 'dollop' of sour cream can set you back 115 calories and a whopping 12g of fat-seven of which are of the artery-clogging  variety. Added bonus: Greek yogurt is packed with muscle-building protein, calcium and B Vitamins. 11. Mash cauliflower instead of mashed potatoes. One cup of traditional mashed potatoes-in all their creamy goodness-has more than 200   calories, compared to mashed cauliflower, which you can typically eat for less than 100 calories per 1 cup serving. Cauliflower is a great source of the antioxidant indole-3-carbinol (I3C), which may help reduce the risk of some cancers, like breast cancer. 12. Ditch the ice cream sundae in favor of a [place] yogurt parfait. Instead of a cup of ice cream or fro-yo for dessert, try 1 cup of nonfat Greek yogurt topped with fresh berries and a sprinkle of cacao nibs. Both toppings are packed with antioxidants, which can help reduce cellular inflammation and oxidative damage. And the comparison is a no-brainer: One cup of ice cream has about 275 calories; one cup of frozen yogurt has about 230; and a cup of Greek yogurt has just 130, plus twice the protein, so you're less likely to return to the freezer for a second helping. 13. Put olive oil in a spray container instead of using it directly from the bottle. Each tablespoon of olive oil is 120 calories and 15g of fat. Use a mister instead of pouring it straight into the pan or onto a salad. This allows for portion control and will save you more than 100 calories. 14. When baking, substitute canned pumpkin for butter or oil. Canned pumpkin-not pumpkin pie mix-is loaded with Vitamin A, which is important for skin and eye health, as well as immunity. And the comparisons are pretty crazy:  cup of canned pumpkin has about 40 calories, compared to butter or oil, which has more than 800 calories. Yes, 800 calories. Applesauce and mashed banana can also serve as good substitutions for butter or oil, usually in a 1:1 ratio. 15. Top casseroles with high-fiber cereal instead of breadcrumbs. Breadcrumbs are typically made with white bread,  while breakfast cereals contain 5-9g of fiber per serving. Not only will you save more than 150 calories per  cup serving, the swap will also keep you more full and you'll get a metabolism boost from the added fiber. 16. Snack on pistachios instead of macadamia nuts. Believe it or not, you get the same amount of calories from 35 pistachios (100 calories) as you would from only five macadamia nuts. 17. Chow down on kale chips rather than potato chips. This is my favorite 'don't knock it 'till you try it' swap. Kale chips are so easy to make at home, and you can spice them up with a little grated parmesan or chili powder. Plus, they're a mere fraction of the calories of potato chips, but with the same crunch factor we crave so often. 18. Add seltzer and some fruit slices to your cocktail instead of soda or fruit juice. One cup of soda or fruit juice can pack on as much as 140 calories. Instead, use seltzer and fruit slices. The fruit provides valuable phytochemicals, such as flavonoids and anthocyanins, which help to combat cancer and stave off the aging process.      Guidelines for a Low Cholesterol, Low Saturated Fat Diet   Fats - Limit total intake of fats and oils. - Avoid butter, stick margarine, shortening, lard, palm and coconut oils. - Limit mayonnaise, salad dressings, gravies and sauces, unless they are homemade with low-fat ingredients. - Limit chocolate. - Choose low-fat and nonfat products, such as low-fat mayonnaise, low-fat or non-hydrogenated peanut butter, low-fat or fat-free salad dressings and nonfat gravy. - Use vegetable oil, such as canola or olive oil. - Look for margarine that does not contain trans fatty acids. - Use nuts in moderate amounts. - Read  ingredient labels carefully to determine both amount and type of fat present in foods. Limit saturated and trans fats! - Avoid high-fat processed and convenience foods.  Meats and Meat Alternatives - Choose fish,  chicken, [place] and lean meats. - Use dried beans, peas, lentils and tofu. - Limit egg yolks to three to four per week. - If you eat red meat, limit to no more than three servings per week and choose loin or round cuts. - Avoid fatty meats, such as bacon, sausage, franks, luncheon meats and ribs. - Avoid all organ meats, including liver.  Dairy - Choose nonfat or low-fat milk, yogurt and cottage cheese. - Most cheeses are high in fat. Choose cheeses made from non-fat milk, such as mozzarella and ricotta cheese. - Choose light or fat-free cream cheese and sour cream. - Avoid cream and sauces made with cream.  Fruits and Vegetables - Eat a wide variety of fruits and vegetables. - Use lemon juice, vinegar or "mist" olive oil on vegetables. - Avoid adding sauces, fat or oil to vegetables.  Breads, Cereals and Grains - Choose whole-grain breads, cereals, pastas and rice. - Avoid high-fat snack foods, such as granola, cookies, pies, pastries, doughnuts and croissants.  Cooking Tips - Avoid deep fried foods. - Trim visible fat off meats and remove skin from poultry before cooking. - Bake, broil, boil, poach or roast poultry, fish and lean meats. - Drain and discard fat that drains out of meat as you cook it. - Add little or no fat to foods. - Use vegetable oil sprays to grease pans for cooking or baking. - Steam vegetables. - Use herbs or no-oil marinades to flavor foods.

## 2018-12-11 NOTE — Progress Notes (Signed)
Impression and Recommendations:    1. Mixed hyperlipidemia- HA's from meds   2. Elevated blood pressure, situational   3. Family history of high cholesterol- brother   4. Overweight (BMI 25.0-29.9)   5. Family history of diabetes mellitus in brother      Mixed Hyperlipidemia - Family History of High Cholesterol (brother) - Started Crestor last OV. - Patient tolerating meds well without complication.  Denies S-E - Continue treatment plan as prescribed.  See med list. - Recommended ongoing statin management given family history.  - Recent CMP WNL.  - Ongoing prudent dietary changes such as low saturated & trans fat and low carb diets discussed with patient.  Encouraged regular exercise and weight loss when appropriate.   - Educational handouts provided at patient's desire.  - Will continue to monitor. - Discussed re-checking after at least 4 months on medication.  History of Elevated BP, Situational - Pt with history of elevated BP. - BP stable at home, 113/78 in office today.  - Ongoing ambulatory BP monitoring encouraged.  - Will continue to monitor.  BMI Counseling - Body mass index is 26.94 kg/m Explained to patient what BMI refers to, and what it means medically.    Told patient to think about it as a "medical risk stratification measurement" and how increasing BMI is associated with increasing risk/ or worsening state of various diseases such as hypertension, hyperlipidemia, diabetes, premature OA, depression etc.  American Heart Association guidelines for healthy diet, basically Mediterranean diet, and exercise guidelines of 30 minutes 5 days per week or more discussed in detail.  - Discussed importance of tracking nutritional intake regarding goals of weight loss. - Encouraged patient to continue prudent, healthier habits, especially physical conditioning.  Health counseling performed.  All questions answered. - Handout provided today on weight loss  strategies and behavioral changes.  Lifestyle & Preventative Health Maintenance - Advised patient to continue working toward exercising to improve overall mental, physical, and emotional health.    - Reviewed the "spokes of the wheel" of mood and health management.  Stressed the importance of ongoing prudent habits, including regular exercise, appropriate sleep hygiene, healthful dietary habits, and prayer/meditation to relax.  - Encouraged patient to engage in daily physical activity, especially a formal exercise routine.  Recommended that the patient eventually strive for at least 150 minutes of moderate cardiovascular activity per week according to guidelines established by the [facility].   - Healthy dietary habits encouraged, including low-carb, and high amounts of lean protein in diet.   - Patient should also consume adequate amounts of water.  Recommendations - Due for fasting labs around 01/14/2019. - Return for chronic health maintenance OV and CPE as scheduled. - Patient knows she may return for acute concerns or weight loss PRN.    Orders Placed This Encounter  Procedures  . Lipid panel    Meds ordered this encounter  Medications  . rosuvastatin (CRESTOR) 10 MG tablet    Sig: Take 1 tablet (10 mg total) by mouth at bedtime.    Dispense:  90 tablet    Refill:  1    Medications Discontinued During This Encounter  Medication Reason  . rosuvastatin (CRESTOR) 10 MG tablet Reorder     Gross side effects, risk and benefits, and alternatives of medications and treatment plan in general discussed with patient.  Patient is aware that all medications have potential side effects and we are unable to predict every side effect or drug-drug interaction that  may occur.   Patient will call with any questions prior to using medication if they have concerns.    Expresses verbal understanding and consents to current therapy and treatment regimen.  No barriers to understanding were identified.   Red flag symptoms and signs discussed in detail.  Patient expressed understanding regarding what to do in case of emergency\urgent symptoms  Please see AVS handed out to patient at the end of our visit for further patient instructions/ counseling done pertaining to today's office visit.   Return for Nov- FLP lab only and then 6mof/up. (also recall we need to see you for yrly physicals as well).     Note:  This note was prepared with assistance of Dragon voice recognition software. Occasional wrong-word or sound-a-like substitutions may have occurred due to the inherent limitations of voice recognition software.   This document serves as a record of services personally performed by DMellody Dance DO. It was created on her behalf by KToni Amend a trained medical scribe. The creation of this record is based on the scribe's personal observations and the provider's statements to them.   I have reviewed the above medical documentation for accuracy and completeness and I concur.  DMellody Dance DO 12/11/2018 9:21 AM        --------------------------------------------------------------------------------------------------------------------------------------------------------------------------------------------------------------------------------------------    Subjective:     HPI: Candice KOZMAis a 53y.o. female [facility].  Recently went on a big vacation.  States she has been doing "quite a bit of cardio," citing riding bikes and walking a lot while on vacation.  States she walks three times per day, about 20 minutes per time.  She is also trying to incorporate weights in the morning 2-3 days per week.  Has been trying to cut carbs and eat less cheese, but notes she loves cheese.  1. HTN HPI:  -  Her blood pressure has been controlled at home.  Pt is occasionally checking it at home.    Notes when she's checked it, it has been in the range of 115/78.  - Patient reports good compliance with blood pressure medications  - Denies medication S-E; denies concerns about statins.   - Smoking Status noted   - She denies new onset of: chest pain, exercise intolerance, shortness of breath, dizziness, visual changes, headache, lower extremity swelling or claudication.   Last 3 blood pressure readings in our office are as follows: BP Readings from Last 3 Encounters:  12/11/18 113/78  09/13/18 (!) 142/94  09/05/18 126/84    Filed Weights   12/11/18 0856  Weight: 172 lb (78 kg)    2. 53y.o. female here for cholesterol follow-up.   - Patient reports good compliance with medications or treatment plan.  States she "actually feels better" since starting her statin.  Has been trying to increase hydration but thinks she has not been doing enough.  - Denies medication S-E   - Smoking Status noted   - She denies new onset of: chest pain, exercise intolerance, shortness of breath, dizziness, visual changes, headache, lower extremity swelling or claudication.   Denies myalgias.  The cholesterol last visit was:  Lab Results  Component Value Date   CHOL 257 (H) 05/16/2018   HDL 41 05/16/2018   LDLCALC 178 (H) 05/16/2018   TRIG 189 (H) 05/16/2018   CHOLHDL 6.3 (H) 05/16/2018    Hepatic Function Latest Ref Rng & Units 12/08/2018  05/16/2018 08/15/2017  Total Protein 6.0 - 8.5 g/dL 6.6 6.7 7.2  Albumin 3.8 - 4.9 g/dL 4.4 4.2 4.7  AST 0 - 40 IU/L 17 18 19   ALT 0 - 32 IU/L 28 31 29   Alk Phosphatase 39 - 117 IU/L 84 81 87  Total Bilirubin 0.0 - 1.2 mg/dL 0.7 0.6 0.7     Wt Readings from Last 3 Encounters:  12/11/18 172 lb (78 kg)  09/13/18 173 lb 6.4 oz (78.7 kg)  09/05/18 175 lb 9.6 oz (79.7 kg)   BP Readings from Last 3 Encounters:  12/11/18 113/78  09/13/18 (!) 142/94  09/05/18 126/84   Pulse Readings from Last 3 Encounters:  12/11/18 91  09/13/18 81  09/05/18 80    BMI Readings from Last 3 Encounters:  12/11/18 26.94 kg/m  09/13/18 27.16 kg/m  09/05/18 27.50 kg/m     Patient Care Team    Relationship Specialty Notifications Start End  Mellody Dance, DO PCP - General Family Medicine  09/06/16   Rosemary Holms, [place] Physician Podiatry  09/06/16   Princess Bruins, MD Consulting Physician Obstetrics and Gynecology  09/06/16      Patient Active Problem List   Diagnosis Date Noted  . h/o Gestational diabetes-  with all 3 children 09/06/2016    Priority: High  . Mixed hyperlipidemia- HA's from meds 09/06/2016    Priority: High  . Family history of diabetes mellitus in brother 09/06/2016    Priority: High  . Overweight (BMI 25.0-29.9) 09/06/2016    Priority: Medium  . Breast mass, right; lumpectomy 2017 04/07/2015    Priority: Medium  . h/o Vitamin D deficiency 09/06/2016    Priority: Low  . Seasonal allergies 09/06/2016    Priority: Low  . Elevated blood pressure, situational 09/13/2018  . Abdominal pain, LLQ 08/15/2017  . Diarrhea 08/15/2017  . Black tarry stools 08/15/2017  . Family history of high cholesterol- brother 09/06/2016  . Family history of lung cancer- parents due to smoking 09/06/2016  . Takes Multiple dietary supplements/ appetite suppressants  09/06/2016  . History of bunionectomy of left great toe 09/06/2016  . History of lumpectomy of right breast - benign 09/06/2016    Past Medical history, Surgical history, Family history, Social history, Allergies and Medications have been entered into the medical record, reviewed and changed as needed.    Current Meds  Medication Sig  . Multiple Vitamin (MULTIVITAMIN) tablet Take 1 tablet by mouth daily.  . rosuvastatin (CRESTOR) 10 MG tablet Take 1 tablet (10 mg total) by mouth at bedtime.  . [DISCONTINUED] rosuvastatin (CRESTOR) 10 MG tablet Take 1 tablet (10 mg total) by mouth daily for 8 days.   Current Facility-Administered Medications for the 12/11/18  encounter (Office Visit) with Mellody Dance, DO  Medication  . 0.9 %  sodium chloride infusion    Allergies:  Allergies  Allergen Reactions  . Penicillins Hives and Shortness Of Breath    hives     Review of Systems:  A fourteen system review of systems was performed and found to be positive as per HPI.   Objective:   Blood pressure 113/78, pulse 91, height 5' 7"  (1.702 m), weight 172 lb (78 kg), SpO2 100 %. Body mass index is 26.94 kg/m. General:  Well Developed, well nourished, appropriate for stated age.  Neuro:  Alert and oriented,  extra-ocular muscles intact  HEENT:  Normocephalic, atraumatic, neck supple, no carotid bruits appreciated  Skin:  no gross rash, warm, pink. Cardiac:  RRR,  S1 S2 Respiratory:  ECTA B/L and A/P, Not using accessory muscles, speaking in full sentences- unlabored. Vascular:  Ext warm, no cyanosis apprec.; cap RF less 2 sec. Psych:  No HI/SI, judgement and insight good, Euthymic mood. Full Affect.

## 2019-01-17 ENCOUNTER — Other Ambulatory Visit: Payer: 59

## 2019-01-17 ENCOUNTER — Other Ambulatory Visit: Payer: Self-pay

## 2019-01-17 DIAGNOSIS — E782 Mixed hyperlipidemia: Secondary | ICD-10-CM

## 2019-01-18 LAB — LIPID PANEL
Chol/HDL Ratio: 3.6 ratio (ref 0.0–4.4)
Cholesterol, Total: 160 mg/dL (ref 100–199)
HDL: 45 mg/dL (ref >39)
LDL Chol Calc (NIH): 89 mg/dL (ref 0–99)
Triglycerides: 147 mg/dL (ref 0–149)
VLDL Cholesterol Cal: 26 mg/dL (ref 5–40)

## 2019-05-21 IMAGING — US US ABDOMEN COMPLETE
1 series · 14 of 25 positions shown · non-contrast
Comparison: None.

CLINICAL DATA: Left lower quadrant abdominal pain

EXAM:
ABDOMEN ULTRASOUND COMPLETE

[Series 1: us abdomen complete · 0.19mm/px · 14 of 104 slices shown]
[im 1/104]
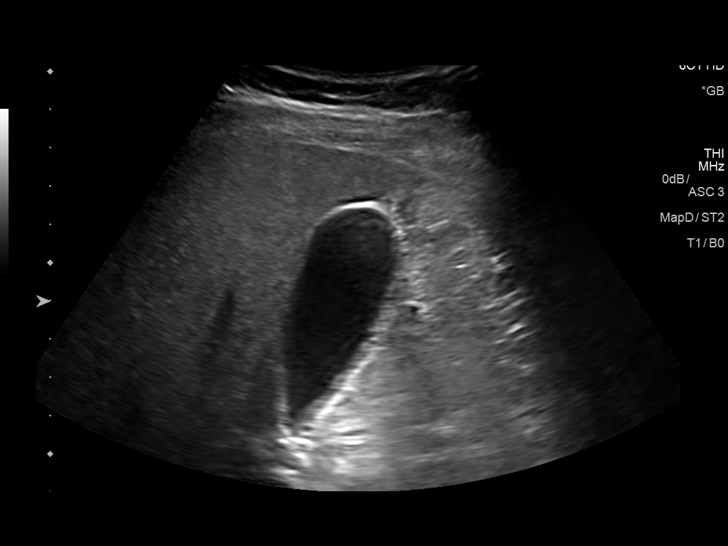
[im 9/104]
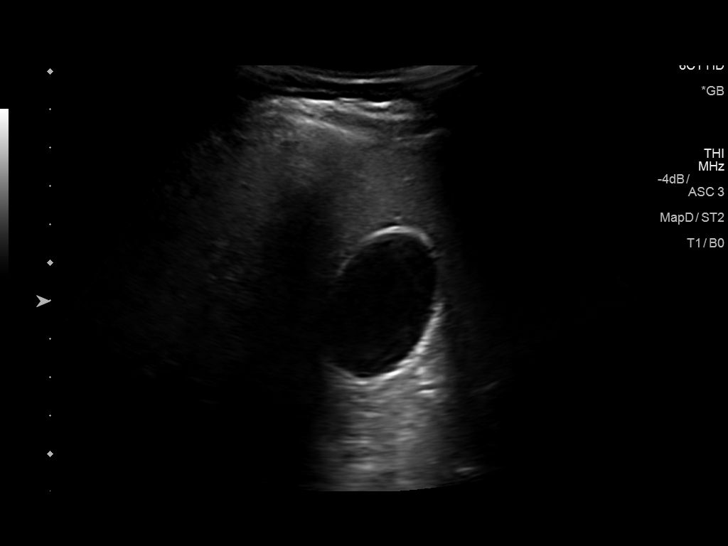
[im 18/104]
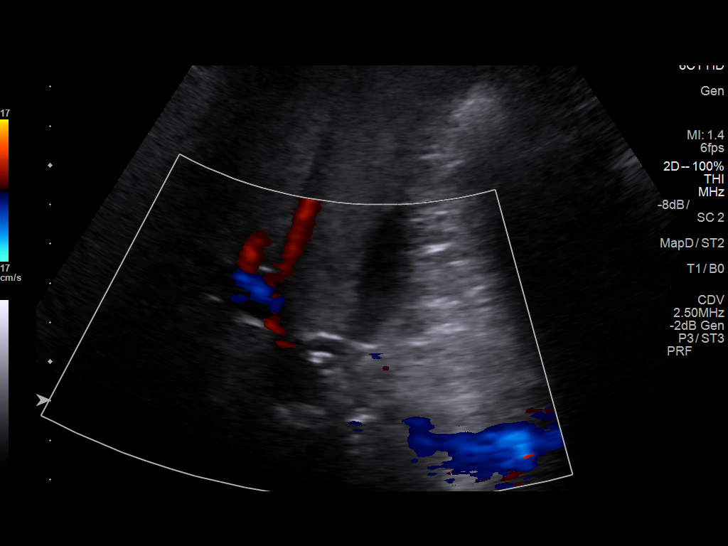
[im 26/104]
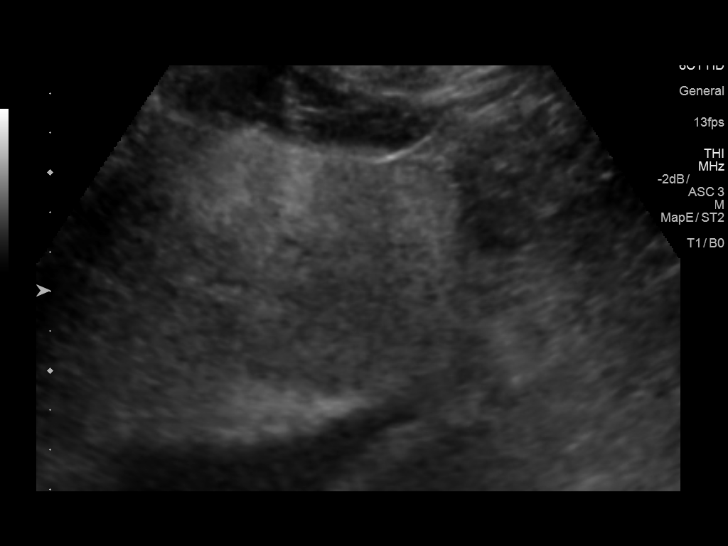
[im 35/104]
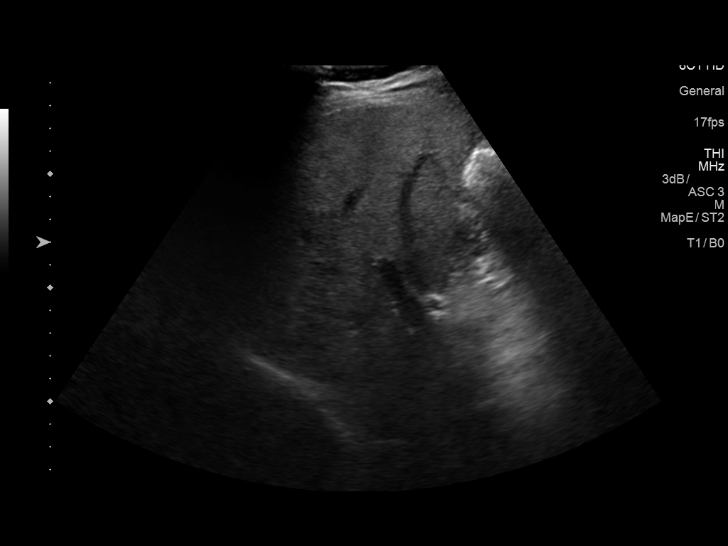
[im 39/104]
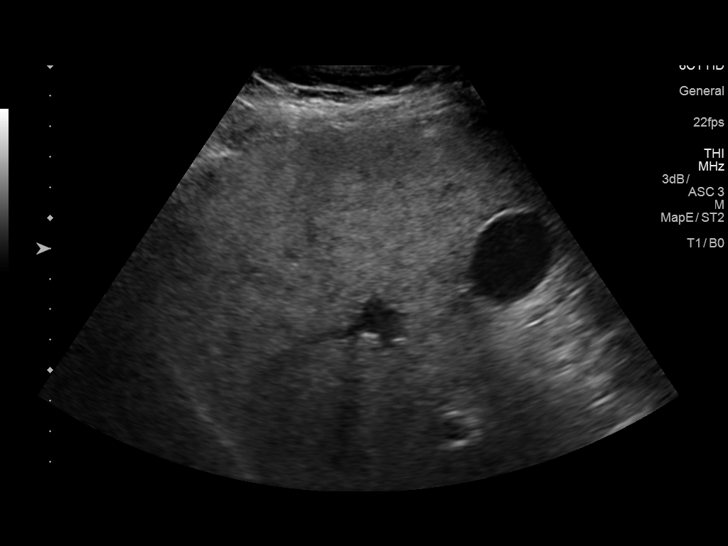
[im 48/104]
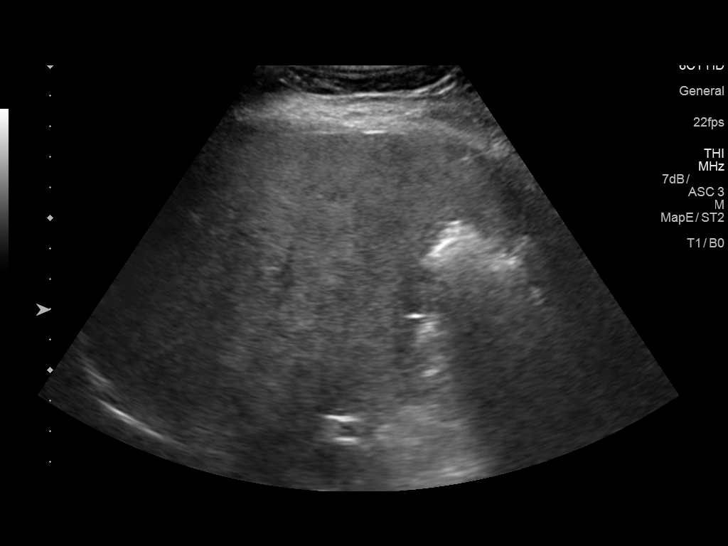
[im 56/104]
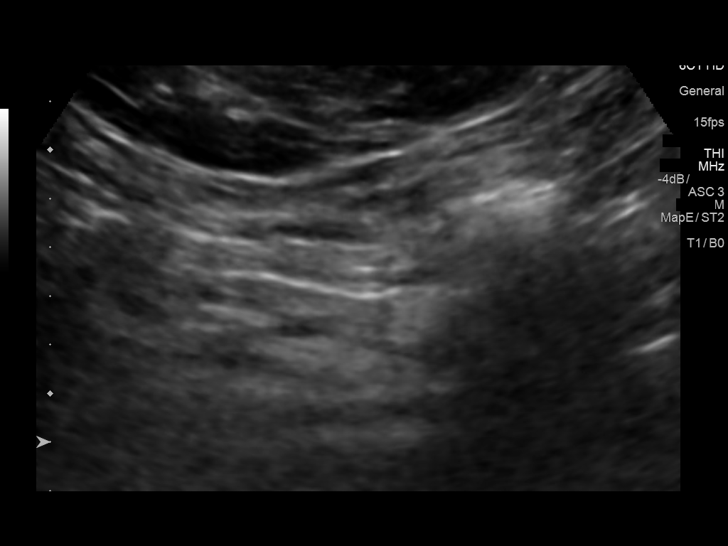
[im 65/104]
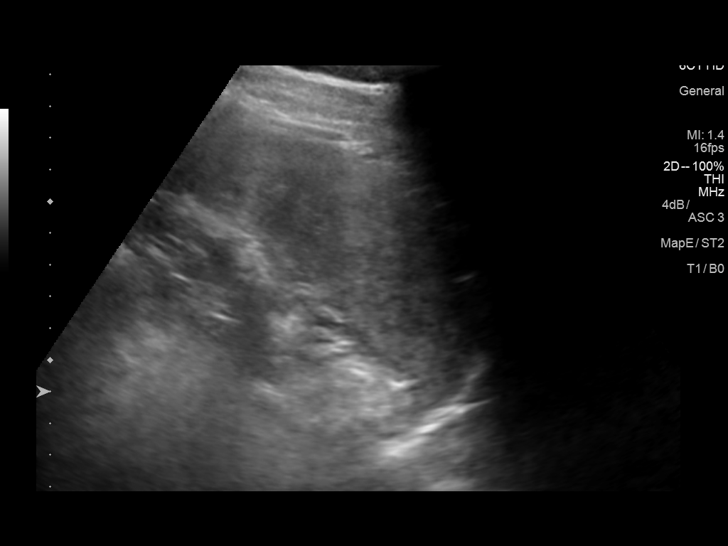
[im 69/104]
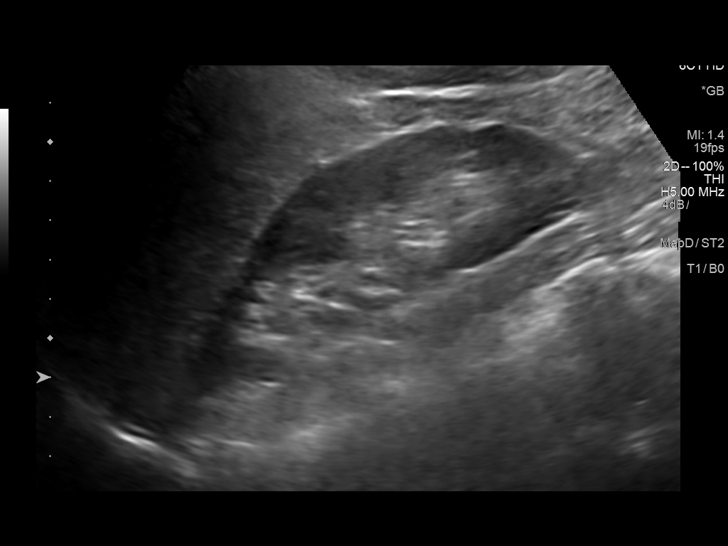
[im 78/104]
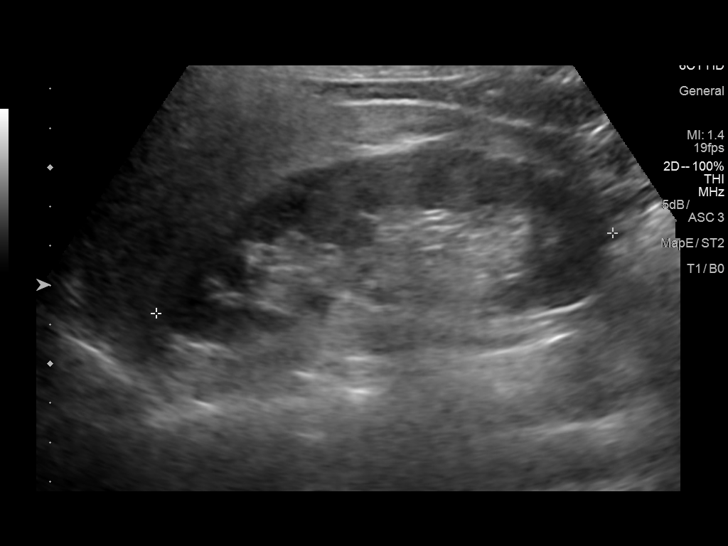
[im 86/104]
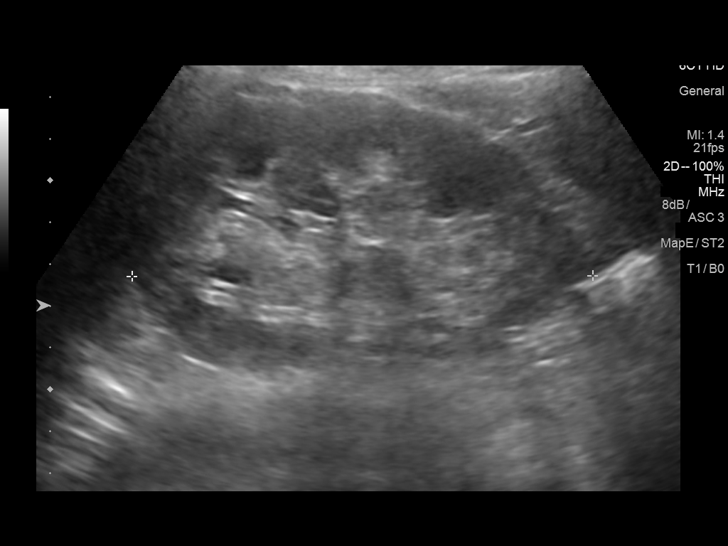
[im 95/104]
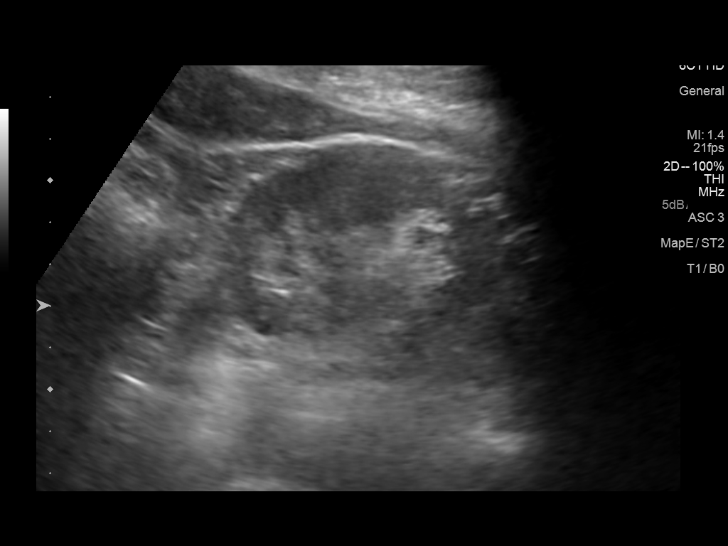
[im 104/104]
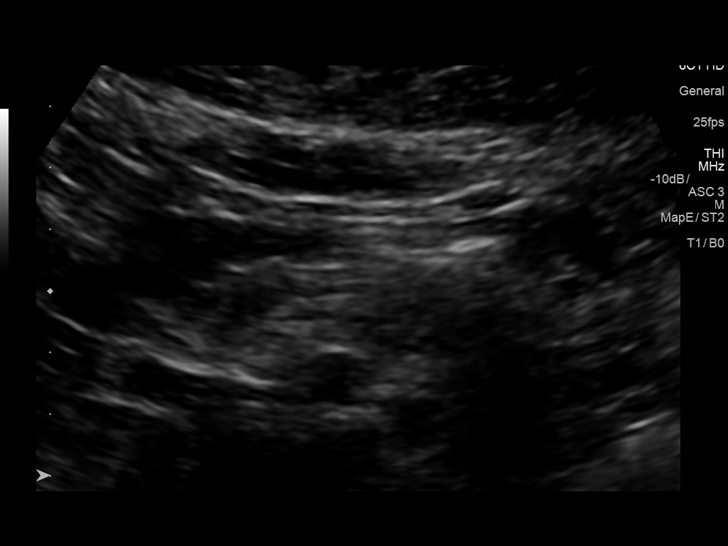

[14 of 25 positions shown; findings below may reference images not displayed]

FINDINGS: Gallbladder: No gallstones or wall thickening visualized. No
sonographic Murphy sign noted by sonographer.

Common bile duct: Diameter: Normal caliber, 4 mm

Liver: Increased echotexture compatible with fatty infiltration. No
focal abnormality or biliary ductal dilatation. Portal vein is
patent on color Doppler imaging with normal direction of blood flow
towards the liver.

IVC: No abnormality visualized.

Pancreas: Visualized portion unremarkable.

Spleen: Size and appearance within normal limits.

Right Kidney: Length: 11.8 cm. Echogenicity within normal limits. No
mass or hydronephrosis visualized.

Left Kidney: Length: 11.1 cm. Echogenicity within normal limits. No
mass or hydronephrosis visualized.

Abdominal aorta: No aneurysm visualized.

Other findings: None.
IMPRESSION: Fatty infiltration of the liver.  No acute findings.

## 2019-10-02 ENCOUNTER — Telehealth: Payer: 59 | Admitting: Emergency Medicine

## 2019-10-02 DIAGNOSIS — R0981 Nasal congestion: Secondary | ICD-10-CM | POA: Diagnosis not present

## 2019-10-02 MED ORDER — DOXYCYCLINE HYCLATE 100 MG PO CAPS: 100.0000 mg | ORAL_CAPSULE | Freq: Two times a day (BID) | ORAL | 0 refills | Status: DC

## 2019-10-02 NOTE — Progress Notes (Signed)
We are sorry that you are not feeling well.  Here is how we plan to help!  Based on what you have shared with me it looks like you have sinusitis.  Sinusitis is inflammation and infection in the sinus cavities of the head.  Based on the length of your illness, I believe you most likely have Acute Bacterial Sinusitis.  This is an infection caused by bacteria and is treated with antibiotics. I have prescribed Doxycycline 100mg  by mouth twice a day for 10 days. You may use an oral decongestant such as Mucinex D or if you have glaucoma or high blood pressure use plain Mucinex. Saline nasal spray help and can safely be used as often as needed for congestion.  If you develop worsening sinus pain, fever or notice severe headache and vision changes, or if symptoms are not better after completion of antibiotic, please schedule an appointment with a health care provider.    Sinus infections are not as easily transmitted as other respiratory infection, however we still recommend that you avoid close contact with loved ones, especially the very young and elderly.  Remember to wash your hands thoroughly throughout the day as this is the number one way to prevent the spread of infection!  Home Care:  Only take medications as instructed by your medical team.  Complete the entire course of an antibiotic.  Do not take these medications with alcohol.  A steam or ultrasonic humidifier can help congestion.  You can place a towel over your head and breathe in the steam from hot water coming from a faucet.  Avoid close contacts especially the very young and the elderly.  Cover your mouth when you cough or sneeze.  Always remember to wash your hands.  Get Help Right Away If:  You develop worsening fever or sinus pain.  You develop a severe head ache or visual changes.  Your symptoms persist after you have completed your treatment plan.  Make sure you  Understand these instructions.  Will watch your  condition.  Will get help right away if you are not doing well or get worse.  Your e-visit answers were reviewed by a board certified advanced clinical practitioner to complete your personal care plan.  Depending on the condition, your plan could have included both over the counter or prescription medications.  If there is a problem please reply  once you have received a response from your provider.  Your safety is important to [place].  If you have drug allergies check your prescription carefully.    You can use MyChart to ask questions about today's visit, request a non-urgent call back, or ask for a work or school excuse for 24 hours related to this e-Visit. If it has been greater than 24 hours you will need to follow up with your provider, or enter a new e-Visit to address those concerns.  You will get an e-mail in the next two days asking about your experience.  I hope that your e-visit has been valuable and will speed your recovery. Thank you for using e-visits.   Approximately 5 minutes was used in reviewing the patient's chart, questionnaire, prescribing medications, and documentation.

## 2019-10-09 LAB — HM MAMMOGRAPHY

## 2019-10-11 ENCOUNTER — Encounter: Payer: Self-pay | Admitting: Physician Assistant

## 2019-10-23 ENCOUNTER — Other Ambulatory Visit: Payer: Self-pay | Admitting: Family Medicine

## 2019-10-23 DIAGNOSIS — E782 Mixed hyperlipidemia: Secondary | ICD-10-CM

## 2019-11-09 ENCOUNTER — Telehealth: Payer: Self-pay | Admitting: Physician Assistant

## 2019-11-09 DIAGNOSIS — E782 Mixed hyperlipidemia: Secondary | ICD-10-CM

## 2019-11-09 MED ORDER — ROSUVASTATIN CALCIUM 10 MG PO TABS: 10.0000 mg | ORAL_TABLET | Freq: Every day | ORAL | 0 refills | Status: DC

## 2019-11-09 NOTE — Addendum Note (Signed)
Addended by: Mickel Crow on: 11/09/2019 11:12 AM   Modules accepted: Orders

## 2019-11-09 NOTE — Telephone Encounter (Signed)
Refill sent to requested pharmacy. AS, CMA 

## 2019-11-09 NOTE — Telephone Encounter (Signed)
Patient has scheduled our next available appt but she is not sch until mid Oct and will be out of her Crestor before then. She is requesting a refill to be sent to [facility] to hold her until her appt.

## 2019-12-31 ENCOUNTER — Encounter: Payer: Self-pay | Admitting: Physician Assistant

## 2019-12-31 ENCOUNTER — Other Ambulatory Visit: Payer: Self-pay

## 2019-12-31 ENCOUNTER — Ambulatory Visit (INDEPENDENT_AMBULATORY_CARE_PROVIDER_SITE_OTHER): Payer: 59 | Admitting: Physician Assistant

## 2019-12-31 VITALS — BP 126/84 | HR 81 | Temp 98.2°F | Ht 65.75 in | Wt 175.6 lb

## 2019-12-31 DIAGNOSIS — E782 Mixed hyperlipidemia: Secondary | ICD-10-CM | POA: Diagnosis not present

## 2019-12-31 DIAGNOSIS — Z1283 Encounter for screening for malignant neoplasm of skin: Secondary | ICD-10-CM

## 2019-12-31 DIAGNOSIS — Z23 Encounter for immunization: Secondary | ICD-10-CM | POA: Diagnosis not present

## 2019-12-31 DIAGNOSIS — Z0000 Encounter for general adult medical examination without abnormal findings: Secondary | ICD-10-CM | POA: Diagnosis not present

## 2019-12-31 DIAGNOSIS — Z6828 Body mass index (BMI) 28.0-28.9, adult: Secondary | ICD-10-CM

## 2019-12-31 MED ORDER — ROSUVASTATIN CALCIUM 10 MG PO TABS: 10.0000 mg | ORAL_TABLET | Freq: Every day | ORAL | 1 refills | Status: DC

## 2019-12-31 NOTE — Progress Notes (Signed)
Female Physical   Impression and Recommendations:    1. Healthcare maintenance   2. Mixed hyperlipidemia- HA's from meds   3. Need for shingles vaccine   4. BMI 28.0-28.9,adult   5. Skin cancer screening      1) Anticipatory Guidance: Discussed skin CA prevention and sunscreen when outside along with skin surveillance; eating a balanced and modest diet; physical activity at least 25 minutes per day or minimum of 150 min/ week moderate to intense activity.  2) Immunizations / Screenings / Labs:   All immunizations are up-to-date per recommendations or will be updated today if pt allows.    - Patient understands with dental and vision screens they will schedule independently.  - Will obtain CBC, CMP, HgA1c, Lipid panel, and TSH when fasting, if not already done past 12 mo/ recently  - Requesting records from [facility] for most recent pap, hep C and HIV screenings - UTD on mammogram, colonoscopy, and Tdap - Declined influenza vaccine.  3) Weight: Goal to improve diet habits to improve overall feelings of well being and objective health data. Improve nutrient density of diet through increasing intake of fruits and vegetables and decreasing saturated fats, white flour products and refined sugars.  4) Healthcare Maintenance: -Continue current medication regimen. Provided refills. -Follow a heart healthy diet and stay as active as possible. -Follow up in 6 months for reg OV: HLD  Meds ordered this encounter  Medications   rosuvastatin (CRESTOR) 10 MG tablet    Sig: Take 1 tablet (10 mg total) by mouth at bedtime.    Dispense:  90 tablet    Refill:  1    Orders Placed This Encounter  Procedures   Varicella-zoster vaccine IM (Shingrix)   CBC with Differential/Platelet   Comprehensive metabolic panel   Hemoglobin A1c   Lipid panel   TSH   Ambulatory referral to Dermatology     Return in about 6 months (around 06/30/2020) for HLD.     Gross side effects,  risk and benefits, and alternatives of medications discussed with patient.  Patient is aware that all medications have potential side effects and we are unable to predict every side effect or drug-drug interaction that may occur.  Expresses verbal understanding and consents to current therapy plan and treatment regimen.  F-up preventative CPE in 1 year- this is in addition to any chronic care visits.    Please see orders placed and AVS handed out to patient at the end of our visit for further patient instructions/ counseling done pertaining to today's office visit.   Subjective:     CPE HPI: Candice Newton is a 54 y.o. female who presents to [place] at [facility] today for a yearly health maintenance exam.   Health Maintenance Summary  - Reviewed and updated, unless pt declines services.  Last Cologuard or Colonoscopy:   12/15/2017- repeat in 10 years Family history of Colon CA: N  Tobacco History Reviewed:  Y, never a smoker Alcohol and/or drug use:    No concerns; no use Dental Home: Y Eye exams: Y Dermatology home: Placed referral.  Female Health:  PAP Smear - last known results: Requesting from OB-GYN STD concerns: none Lumps or breast concerns: none Breast Cancer Family History:  Y  Additional concerns beyond health maintenance issues:     Immunization History  Administered Date(s) Administered   Tdap 08/06/2008, 09/05/2018     Health Maintenance  Topic Date Due   PAP SMEAR-Modifier  02/07/2014  COVID-19 Vaccine (1) 01/16/2020 (Originally 06/26/1977)   INFLUENZA VACCINE  06/12/2020 (Originally 10/14/2019)   Hepatitis C Screening  12/30/2020 (Originally 1966/02/09)   HIV Screening  09/06/2028 (Originally 06/25/1981)   MAMMOGRAM  10/08/2021   COLONOSCOPY  12/16/2027   TETANUS/TDAP  09/04/2028     Wt Readings from Last 3 Encounters:  12/31/19 175 lb 9.6 oz (79.7 kg)  12/11/18 172 lb (78 kg)  09/13/18 173 lb 6.4 oz (78.7 kg)   BP  Readings from Last 3 Encounters:  12/31/19 126/84  12/11/18 113/78  09/13/18 (!) 142/94   Pulse Readings from Last 3 Encounters:  12/31/19 81  12/11/18 91  09/13/18 81     Past Medical History:  Diagnosis Date   Breast mass    right   Hyperlipidemia       Past Surgical History:  Procedure Laterality Date   APPENDECTOMY     BREAST LUMPECTOMY WITH RADIOACTIVE SEED LOCALIZATION Right 04/07/2015   Procedure: RIGHT BREAST LUMPECTOMY WITH RADIOACTIVE SEED LOCALIZATION;  Surgeon: Fanny Skates, MD;  Location: [facility];  Service: General;  Laterality: Right;   BUNIONECTOMY Left    TONSILLECTOMY        Family History  Problem Relation Age of Onset   Cancer Mother        lung   Cancer Father        lung   Diabetes Brother    Hyperlipidemia Brother    Thyroid disease Brother    Esophageal cancer Brother        75 dx esophageal ca- mets from Lung ca   Heart disease Maternal Grandmother    Colon cancer Neg Hx    Rectal cancer Neg Hx    Stomach cancer Neg Hx       Social History   Substance and Sexual Activity  Drug Use No  ,   Social History   Substance and Sexual Activity  Alcohol Use Yes   Comment: social  ,   Social History   Tobacco Use  Smoking Status Never Smoker  Smokeless Tobacco Never Used  ,   Social History   Substance and Sexual Activity  Sexual Activity Yes   Birth control/protection: I.U.D.    Current Outpatient Medications on File Prior to Visit  Medication Sig Dispense Refill   Multiple Vitamin (MULTIVITAMIN) tablet Take 1 tablet by mouth daily.     No current facility-administered medications on file prior to visit.    Allergies: Penicillins  Review of Systems: General:   Denies fever, chills, unexplained weight loss.  Optho/Auditory:   Denies visual changes, blurred vision/LOV Respiratory:   Denies SOB, DOE more than baseline levels.   Cardiovascular:   Denies chest pain, palpitations,  new onset peripheral edema  Gastrointestinal:   Denies nausea, vomiting, diarrhea.  Genitourinary: Denies dysuria, freq/ urgency, flank pain  Endocrine:     Denies hot or cold intolerance, polyuria, polydipsia. Musculoskeletal:   Denies unexplained myalgias, joint swelling, unexplained arthralgias, gait problems.  Skin:  Denies rash, suspicious lesions Neurological:     Denies dizziness, unexplained weakness, numbness  Psychiatric/Behavioral:   Denies mood changes, suicidal or homicidal ideations, hallucinations    Objective:    Blood pressure 126/84, pulse 81, temperature 98.2 F (36.8 C), temperature source Oral, height 5' 5.75" (1.67 m), weight 175 lb 9.6 oz (79.7 kg), SpO2 98 %. Body mass index is 28.56 kg/m. General Appearance:    Alert, cooperative, no distress, appears stated age  Head:    Normocephalic,  without obvious abnormality, atraumatic  Eyes:    PERRL, conjunctiva/corneas clear, EOM's intact, both eyes  Ears:    Normal TM's and external ear canals, both ears  Nose:   Nares normal, septum midline, mucosa normal, no drainage    or sinus tenderness  Throat:   Lips w/o lesion, mucosa moist, and tongue normal; teeth and   gums normal  Neck:   Supple, symmetrical, trachea midline, no adenopathy;    thyroid:  no enlargement/tenderness/nodules; no carotid   bruit or JVD  Back:     Symmetric, no curvature, ROM normal, no CVA tenderness  Lungs:     Clear to auscultation bilaterally, respirations unlabored, no       Wh/ R/ R  Chest Wall:    No tenderness or gross deformity; normal excursion   Heart:    Regular rate and rhythm, S1 and S2 normal, no murmur  Breast Exam:    Deferred to Ob-Gyn  Abdomen:     Soft, non-tender, bowel sounds active all four quadrants, No   G/R/R, no masses, no organomegaly  Genitalia:    Deferred to Ob-Gyn  Rectal:    Deferred to Ob-Gyn  Extremities:   Extremities normal, atraumatic, no cyanosis or gross edema  Pulses:   2+ and symmetric all  extremities  Skin:   Warm, dry, Skin color, texture, turgor normal, no obvious rashes or lesions Psych: No HI/SI, judgement and insight good, Euthymic mood. Full Affect.  Neurologic:   CNII-XII grossly intact, normal strength, sensation and reflexes throughout

## 2019-12-31 NOTE — Patient Instructions (Signed)
Preventive Care 40-53 Years Old, Female Preventive care refers to visits with your health care provider and lifestyle choices that can promote health and wellness. This includes:  A yearly physical exam. This may also be called an annual well check.  Regular dental visits and eye exams.  Immunizations.  Screening for certain conditions.  Healthy lifestyle choices, such as eating a healthy diet, getting regular exercise, not using drugs or products that contain nicotine and tobacco, and limiting alcohol use. What can I expect for my preventive care visit? Physical exam Your health care provider will check your:  Height and weight. This may be used to calculate body mass index (BMI), which tells if you are at a healthy weight.  Heart rate and blood pressure.  Skin for abnormal spots. Counseling Your health care provider may ask you questions about your:  Alcohol, tobacco, and drug use.  Emotional well-being.  Home and relationship well-being.  Sexual activity.  Eating habits.  Work and work environment.  Method of birth control.  Menstrual cycle.  Pregnancy history. What immunizations do I need?  Influenza (flu) vaccine  This is recommended every year. Tetanus, diphtheria, and pertussis (Tdap) vaccine  You may need a Td booster every 10 years. Varicella (chickenpox) vaccine  You may need this if you have not been vaccinated. Zoster (shingles) vaccine  You may need this after age 60. Measles, mumps, and rubella (MMR) vaccine  You may need at least one dose of MMR if you were born in 1957 or later. You may also need a second dose. Pneumococcal conjugate (PCV13) vaccine  You may need this if you have certain conditions and were not previously vaccinated. Pneumococcal polysaccharide (PPSV23) vaccine  You may need one or two doses if you smoke cigarettes or if you have certain conditions. Meningococcal conjugate (MenACWY) vaccine  You may need this if you  have certain conditions. Hepatitis A vaccine  You may need this if you have certain conditions or if you travel or work in places where you may be exposed to hepatitis A. Hepatitis B vaccine  You may need this if you have certain conditions or if you travel or work in places where you may be exposed to hepatitis B. Haemophilus influenzae type b (Hib) vaccine  You may need this if you have certain conditions. Human papillomavirus (HPV) vaccine  If recommended by your health care provider, you may need three doses over 6 months. You may receive vaccines as individual doses or as more than one vaccine together in one shot (combination vaccines). Talk with your health care provider about the risks and benefits of combination vaccines. What tests do I need? Blood tests  Lipid and cholesterol levels. These may be checked every 5 years, or more frequently if you are over 50 years old.  Hepatitis C test.  Hepatitis B test. Screening  Lung cancer screening. You may have this screening every year starting at age 53 if you have a 30-pack-year history of smoking and currently smoke or have quit within the past 15 years.  Colorectal cancer screening. All adults should have this screening starting at age 53 and continuing until age 75. Your health care provider may recommend screening at age 45 if you are at increased risk. You will have tests every 1-10 years, depending on your results and the type of screening test.  Diabetes screening. This is done by checking your blood sugar (glucose) after you have not eaten for a while (fasting). You may have this   done every 1-3 years.  Mammogram. This may be done every 1-2 years. Talk with your health care provider about when you should start having regular mammograms. This may depend on whether you have a family history of breast cancer.  BRCA-related cancer screening. This may be done if you have a family history of breast, ovarian, tubal, or peritoneal  cancers.  Pelvic exam and Pap test. This may be done every 3 years starting at age 53. Starting at age 53, this may be done every 5 years if you have a Pap test in combination with an HPV test. Other tests  Sexually transmitted disease (STD) testing.  Bone density scan. This is done to screen for osteoporosis. You may have this scan if you are at high risk for osteoporosis. Follow these instructions at home: Eating and drinking  Eat a diet that includes fresh fruits and vegetables, whole grains, lean protein, and low-fat dairy.  Take vitamin and mineral supplements as recommended by your health care provider.  Do not drink alcohol if: ? Your health care provider tells you not to drink. ? You are pregnant, may be pregnant, or are planning to become pregnant.  If you drink alcohol: ? Limit how much you have to 0-1 drink a day. ? Be aware of how much alcohol is in your drink. In the [place] one drink equals one 12 oz bottle of beer (355 mL), one 5 oz glass of wine (148 mL), or one 1 oz glass of hard liquor (44 mL). Lifestyle  Take daily care of your teeth and gums.  Stay active. Exercise for at least 30 minutes on 5 or more days each week.  Do not use any products that contain nicotine or tobacco, such as cigarettes, e-cigarettes, and chewing tobacco. If you need help quitting, ask your health care provider.  If you are sexually active, practice safe sex. Use a condom or other form of birth control (contraception) in order to prevent pregnancy and STIs (sexually transmitted infections).  If told by your health care provider, take low-dose aspirin daily starting at age 53. What's next?  Visit your health care provider once a year for a well check visit.  Ask your health care provider how often you should have your eyes and teeth checked.  Stay up to date on all vaccines. This information is not intended to replace advice given to you by your health care provider. Make sure you  discuss any questions you have with your health care provider. Document Revised: 11/10/2017 Document Reviewed: 11/10/2017 Elsevier Patient Education  [place] Heart-healthy meal planning includes:  Eating less unhealthy fats.  Eating more healthy fats.  Making other changes in your diet. Talk with your doctor or a diet specialist (dietitian) to create an eating plan that is right for you. What is my plan? Your doctor may recommend an eating plan that includes:  Total fat: ______% or less of total calories a day.  Saturated fat: ______% or less of total calories a day.  Cholesterol: less than _________mg a day. What are tips for following this plan? Cooking Avoid frying your food. Try to bake, boil, grill, or broil it instead. You can also reduce fat by:  Removing the skin from poultry.  Removing all visible fats from meats.  Steaming vegetables in water or broth. Meal planning   At meals, divide your plate into four equal parts: ? Fill one-half of your plate with vegetables and green salads. ?  Fill one-fourth of your plate with whole grains. ? Fill one-fourth of your plate with lean protein foods.  Eat 4-5 servings of vegetables per day. A serving of vegetables is: ? 1 cup of raw or cooked vegetables. ? 2 cups of raw leafy greens.  Eat 4-5 servings of fruit per day. A serving of fruit is: ? 1 medium whole fruit. ?  cup of dried fruit. ?  cup of fresh, frozen, or canned fruit. ?  cup of 100% fruit juice.  Eat more foods that have soluble fiber. These are apples, broccoli, carrots, beans, peas, and barley. Try to get 20-30 g of fiber per day.  Eat 4-5 servings of nuts, legumes, and seeds per week: ? 1 serving of dried beans or legumes equals  cup after being cooked. ? 1 serving of nuts is  cup. ? 1 serving of seeds equals 1 tablespoon. General information  Eat more home-cooked food. Eat less restaurant, buffet, and  fast food.  Limit or avoid alcohol.  Limit foods that are high in starch and sugar.  Avoid fried foods.  Lose weight if you are overweight.  Keep track of how much salt (sodium) you eat. This is important if you have high blood pressure. Ask your doctor to tell you more about this.  Try to add vegetarian meals each week. Fats  Choose healthy fats. These include olive oil and canola oil, flaxseeds, walnuts, almonds, and seeds.  Eat more omega-3 fats. These include salmon, mackerel, sardines, tuna, flaxseed oil, and ground flaxseeds. Try to eat fish at least 2 times each week.  Check food labels. Avoid foods with trans fats or high amounts of saturated fat.  Limit saturated fats. ? These are often found in animal products, such as meats, butter, and cream. ? These are also found in plant foods, such as palm oil, palm kernel oil, and coconut oil.  Avoid foods with partially hydrogenated oils in them. These have trans fats. Examples are stick margarine, some tub margarines, cookies, crackers, and other baked goods. What foods can I eat? Fruits All fresh, canned (in natural juice), or frozen fruits. Vegetables Fresh or frozen vegetables (raw, steamed, roasted, or grilled). Green salads. Grains Most grains. Choose whole wheat and whole grains most of the time. Rice and pasta, including brown rice and pastas made with whole wheat. Meats and other proteins Lean, well-trimmed beef, veal, pork, and lamb. Chicken and [place] without skin. All fish and shellfish. Wild duck, rabbit, pheasant, and venison. Egg whites or low-cholesterol egg substitutes. Dried beans, peas, lentils, and tofu. Seeds and most nuts. Dairy Low-fat or nonfat cheeses, including ricotta and mozzarella. Skim or 1% milk that is liquid, powdered, or evaporated. Buttermilk that is made with low-fat milk. Nonfat or low-fat yogurt. Fats and oils Non-hydrogenated (trans-free) margarines. Vegetable oils, including soybean,  sesame, sunflower, olive, peanut, safflower, corn, canola, and cottonseed. Salad dressings or mayonnaise made with a vegetable oil. Beverages Mineral water. Coffee and tea. Diet carbonated beverages. Sweets and desserts Sherbet, gelatin, and fruit ice. Small amounts of dark chocolate. Limit all sweets and desserts. Seasonings and condiments All seasonings and condiments. The items listed above may not be a complete list of foods and drinks you can eat. Contact a dietitian for more options. What foods should I avoid? Fruits Canned fruit in heavy syrup. Fruit in cream or butter sauce. Fried fruit. Limit coconut. Vegetables Vegetables cooked in cheese, cream, or butter sauce. Fried vegetables. Grains Breads that are made with saturated or  trans fats, oils, or whole milk. Croissants. Sweet rolls. Donuts. High-fat crackers, such as cheese crackers. Meats and other proteins Fatty meats, such as hot dogs, ribs, sausage, bacon, rib-eye roast or steak. High-fat deli meats, such as salami and bologna. Caviar. Domestic duck and goose. Organ meats, such as liver. Dairy Cream, sour cream, cream cheese, and creamed cottage cheese. Whole-milk cheeses. Whole or 2% milk that is liquid, evaporated, or condensed. Whole buttermilk. Cream sauce or high-fat cheese sauce. Yogurt that is made from whole milk. Fats and oils Meat fat, or shortening. Cocoa butter, hydrogenated oils, palm oil, coconut oil, palm kernel oil. Solid fats and shortenings, including bacon fat, salt pork, lard, and butter. Nondairy cream substitutes. Salad dressings with cheese or sour cream. Beverages Regular sodas and juice drinks with added sugar. Sweets and desserts Frosting. Pudding. Cookies. Cakes. Pies. Milk chocolate or white chocolate. Buttered syrups. Full-fat ice cream or ice cream drinks. The items listed above may not be a complete list of foods and drinks to avoid. Contact a dietitian for more  information. Summary  Heart-healthy meal planning includes eating less unhealthy fats, eating more healthy fats, and making other changes in your diet.  Eat a balanced diet. This includes fruits and vegetables, low-fat or nonfat dairy, lean protein, nuts and legumes, whole grains, and heart-healthy oils and fats. This information is not intended to replace advice given to you by your health care provider. Make sure you discuss any questions you have with your health care provider. Document Revised: 05/05/2017 Document Reviewed: 04/08/2017 Elsevier Patient Education  2020 [employer].

## 2020-01-01 LAB — COMPREHENSIVE METABOLIC PANEL
ALT: 29 IU/L (ref 0–32)
AST: 21 IU/L (ref 0–40)
Albumin/Globulin Ratio: 2 (ref 1.2–2.2)
Albumin: 4.8 g/dL (ref 3.8–4.9)
Alkaline Phosphatase: 86 IU/L (ref 44–121)
BUN/Creatinine Ratio: 18 (ref 9–23)
BUN: 14 mg/dL (ref 6–24)
Bilirubin Total: 0.6 mg/dL (ref 0.0–1.2)
CO2: 25 mmol/L (ref 20–29)
Calcium: 9.5 mg/dL (ref 8.7–10.2)
Chloride: 102 mmol/L (ref 96–106)
Creatinine, Ser: 0.76 mg/dL (ref 0.57–1.00)
GFR calc Af Amer: 104 mL/min/{1.73_m2} (ref >59)
GFR calc non Af Amer: 90 mL/min/{1.73_m2} (ref >59)
Globulin, Total: 2.4 g/dL (ref 1.5–4.5)
Glucose: 112 mg/dL — ABNORMAL HIGH (ref 65–99)
Potassium: 4.5 mmol/L (ref 3.5–5.2)
Sodium: 141 mmol/L (ref 134–144)
Total Protein: 7.2 g/dL (ref 6.0–8.5)

## 2020-01-01 LAB — CBC WITH DIFFERENTIAL/PLATELET
Basophils Absolute: 0.1 10*3/uL (ref 0.0–0.2)
Basos: 1 %
EOS (ABSOLUTE): 0.1 10*3/uL (ref 0.0–0.4)
Eos: 2 %
Hematocrit: 43.6 % (ref 34.0–46.6)
Hemoglobin: 14.5 g/dL (ref 11.1–15.9)
Immature Grans (Abs): 0 10*3/uL (ref 0.0–0.1)
Immature Granulocytes: 0 %
Lymphocytes Absolute: 1.6 10*3/uL (ref 0.7–3.1)
Lymphs: 33 %
MCH: 28.5 pg (ref 26.6–33.0)
MCHC: 33.3 g/dL (ref 31.5–35.7)
MCV: 86 fL (ref 79–97)
Monocytes Absolute: 0.4 10*3/uL (ref 0.1–0.9)
Monocytes: 9 %
Neutrophils Absolute: 2.7 10*3/uL (ref 1.4–7.0)
Neutrophils: 55 %
Platelets: 207 10*3/uL (ref 150–450)
RBC: 5.08 x10E6/uL (ref 3.77–5.28)
RDW: 13 % (ref 11.7–15.4)
WBC: 4.9 10*3/uL (ref 3.4–10.8)

## 2020-01-01 LAB — LIPID PANEL
Chol/HDL Ratio: 3.5 ratio (ref 0.0–4.4)
Cholesterol, Total: 164 mg/dL (ref 100–199)
HDL: 47 mg/dL (ref >39)
LDL Chol Calc (NIH): 88 mg/dL (ref 0–99)
Triglycerides: 166 mg/dL — ABNORMAL HIGH (ref 0–149)
VLDL Cholesterol Cal: 29 mg/dL (ref 5–40)

## 2020-01-01 LAB — TSH: TSH: 1.49 u[IU]/mL (ref 0.450–4.500)

## 2020-01-01 LAB — HEMOGLOBIN A1C
Est. average glucose Bld gHb Est-mCnc: 120 mg/dL
Hgb A1c MFr Bld: 5.8 % — ABNORMAL HIGH (ref 4.8–5.6)

## 2020-03-03 ENCOUNTER — Other Ambulatory Visit: Payer: Self-pay

## 2020-03-03 ENCOUNTER — Ambulatory Visit (INDEPENDENT_AMBULATORY_CARE_PROVIDER_SITE_OTHER): Payer: 59

## 2020-03-03 DIAGNOSIS — Z23 Encounter for immunization: Secondary | ICD-10-CM | POA: Diagnosis not present

## 2020-03-03 NOTE — Progress Notes (Signed)
Pt here for Shingrix vaccine.  Screening questionnaire reviewed, VIS provided to patient, and any/all patient questions answered.  T. Jamyia Fortune, CMA  

## 2020-05-16 ENCOUNTER — Telehealth: Payer: Self-pay | Admitting: Physician Assistant

## 2020-05-16 NOTE — Telephone Encounter (Signed)
Pt advised no available apt in office today. Suggested covid testing. Patient is aware of this and verbalized understanding. AS, CMA

## 2020-06-05 ENCOUNTER — Other Ambulatory Visit: Payer: Self-pay

## 2020-06-05 ENCOUNTER — Encounter: Payer: Self-pay | Admitting: Physician Assistant

## 2020-06-05 ENCOUNTER — Ambulatory Visit: Payer: 59 | Admitting: Physician Assistant

## 2020-06-05 DIAGNOSIS — L821 Other seborrheic keratosis: Secondary | ICD-10-CM

## 2020-06-05 DIAGNOSIS — D1800 Hemangioma unspecified site: Secondary | ICD-10-CM

## 2020-06-05 DIAGNOSIS — L578 Other skin changes due to chronic exposure to nonionizing radiation: Secondary | ICD-10-CM

## 2020-06-05 DIAGNOSIS — L814 Other melanin hyperpigmentation: Secondary | ICD-10-CM

## 2020-06-05 DIAGNOSIS — Z1283 Encounter for screening for malignant neoplasm of skin: Secondary | ICD-10-CM | POA: Diagnosis not present

## 2020-06-05 DIAGNOSIS — L719 Rosacea, unspecified: Secondary | ICD-10-CM | POA: Diagnosis not present

## 2020-06-05 NOTE — Progress Notes (Signed)
   New Patient   Subjective  Candice Newton is a 55 y.o. female who presents for the following: Annual Exam (Full body skin check. Lesions on the right side of face, per patient wont go away x year, some will pop, but wont go away. Nose is continually red. ).   The following portions of the chart were reviewed this encounter and updated as appropriate:      Objective  Well appearing patient in no apparent distress; mood and affect are within normal limits.  A full examination was performed including scalp, head, eyes, ears, nose, lips, neck, chest, axillae, abdomen, back, buttocks, bilateral upper extremities, bilateral lower extremities, hands, feet, fingers, toes, fingernails, and toenails. All findings within normal limits unless otherwise noted below.  Objective  head to toe: Full body skin exam. No atypical moles, no skin cancer.   Objective  central face: redness with telangectasia's   Assessment & Plan  Screening exam for skin cancer head to toe  Yearly skin check  Rosacea central face  Sun protection. Laser treatment if pt. desires  Lentigines - Scattered tan macules - Discussed due to sun exposure - Benign, observe - Call for any changes  Seborrheic Keratoses - Stuck-on, waxy, tan-brown papules and plaques  - Discussed benign etiology and prognosis. - Observe - Call for any changes   Hemangiomas - Red papules - Discussed benign nature - Observe - Call for any changes  Actinic Damage - diffuse scaly erythematous macules with underlying dyspigmentation - Recommend daily broad spectrum sunscreen SPF 30+ to sun-exposed areas, reapply every 2 hours as needed.  - Call for new or changing lesions.     I, Nessie Nong, PA-C, have reviewed all documentation for this visit. The documentation on 06/05/20 for the exam, diagnosis, procedures, and orders are all accurate and complete.

## 2020-06-05 NOTE — Patient Instructions (Signed)
Amlactin

## 2020-07-18 ENCOUNTER — Other Ambulatory Visit: Payer: Self-pay | Admitting: Physician Assistant

## 2020-07-18 DIAGNOSIS — E782 Mixed hyperlipidemia: Secondary | ICD-10-CM

## 2020-07-21 ENCOUNTER — Telehealth: Payer: Self-pay | Admitting: Physician Assistant

## 2020-07-21 NOTE — Telephone Encounter (Signed)
Please contact patient to schedule per last AVS for med refills. AS, CMA

## 2020-07-21 NOTE — Telephone Encounter (Signed)
Tried to contact patient but mailbox was full and could not leave a message.

## 2020-08-06 ENCOUNTER — Ambulatory Visit: Payer: 59 | Admitting: Physician Assistant

## 2020-08-06 ENCOUNTER — Other Ambulatory Visit: Payer: Self-pay

## 2020-08-06 ENCOUNTER — Encounter: Payer: Self-pay | Admitting: Physician Assistant

## 2020-08-06 DIAGNOSIS — E782 Mixed hyperlipidemia: Secondary | ICD-10-CM

## 2020-08-06 MED ORDER — ROSUVASTATIN CALCIUM 10 MG PO TABS: 10.0000 mg | ORAL_TABLET | Freq: Every day | ORAL | 1 refills | Status: DC

## 2020-08-06 NOTE — Progress Notes (Signed)
Established Patient Office Visit  Subjective:  Patient ID: Candice Newton, female    DOB: 1965-10-05  Age: 55 y.o. MRN: [id number]  CC:  Chief Complaint  Patient presents with  . Follow-up  . Hyperlipidemia    HPI Candice Newton presents for follow up on hyperlipidemia. Has no acute concerns today. Pt taking medication as directed without issues. Denies side effects including myalgias and arthralgias. Staying active with swimming and biking. Denies diet changes.    Past Medical History:  Diagnosis Date  . Breast mass    right  . Hyperlipidemia     Past Surgical History:  Procedure Laterality Date  . APPENDECTOMY    . BREAST LUMPECTOMY WITH RADIOACTIVE SEED LOCALIZATION Right 04/07/2015   Procedure: RIGHT BREAST LUMPECTOMY WITH RADIOACTIVE SEED LOCALIZATION;  Surgeon: Fanny Skates, MD;  Location: [facility];  Service: General;  Laterality: Right;  . BUNIONECTOMY Left   . TONSILLECTOMY      Family History  Problem Relation Age of Onset  . Cancer Mother        lung  . Cancer Father        lung  . Diabetes Brother   . Hyperlipidemia Brother   . Thyroid disease Brother   . Esophageal cancer Brother        23 dx esophageal ca- mets from Lung ca  . Heart disease Maternal Grandmother   . Colon cancer Neg Hx   . Rectal cancer Neg Hx   . Stomach cancer Neg Hx     Social History   Socioeconomic History  . Marital status: Married    Spouse name: Not on file  . Number of children: 3  . Years of education: Not on file  . Highest education level: Not on file  Occupational History  . Occupation: Press photographer  Tobacco Use  . Smoking status: Never Smoker  . Smokeless tobacco: Never Used  Vaping Use  . Vaping Use: Never used  Substance and Sexual Activity  . Alcohol use: Yes    Comment: social  . Drug use: No  . Sexual activity: Yes    Birth control/protection: I.U.D.  Other Topics Concern  . Not on file  Social History Narrative  . Not on file   Social  Determinants of Health   Financial Resource Strain: Not on file  Food Insecurity: Not on file  Transportation Needs: Not on file  Physical Activity: Not on file  Stress: Not on file  Social Connections: Not on file  Intimate Partner Violence: Not on file    Outpatient Medications Prior to Visit  Medication Sig Dispense Refill  . Multiple Vitamin (MULTIVITAMIN) tablet Take 1 tablet by mouth daily.    . rosuvastatin (CRESTOR) 10 MG tablet Take 1 tablet (10 mg total) by mouth at bedtime. **NEEDS APT FOR REFILLS** 30 tablet 0  . estradiol (CLIMARA - DOSED IN MG/24 HR) 0.0375 mg/24hr patch 0.0375 mg once a week.     No facility-administered medications prior to visit.    Allergies  Allergen Reactions  . Penicillins Hives and Shortness Of Breath    hives    ROS Review of Systems Review of Systems:  A fourteen system review of systems was performed and found to be positive as per HPI.   Objective:    Physical Exam General:  Well Developed, well nourished, appropriate for stated age.  Neuro:  Alert and oriented,  extra-ocular muscles intact  HEENT:  Normocephalic, atraumatic, neck supple Skin:  no  gross rash, warm, pink. Cardiac:  RRR, S1 S2, no murmur  Respiratory:  ECTA B/L, Not using accessory muscles, speaking in full sentences- unlabored. Vascular:  Ext warm, no cyanosis apprec.; cap RF less 2 sec. Psych:  No HI/SI, judgement and insight good, Euthymic mood. Full Affect.   BP 116/77   Pulse 88   Temp 99 F (37.2 C)   Ht 5\' 6"  (1.676 m)   Wt 173 lb 8 oz (78.7 kg)   SpO2 91%   BMI 28.00 kg/m  Wt Readings from Last 3 Encounters:  08/06/20 173 lb 8 oz (78.7 kg)  12/31/19 175 lb 9.6 oz (79.7 kg)  12/11/18 172 lb (78 kg)     Health Maintenance Due  Topic Date Due  . COVID-19 Vaccine (1) Never done  . PAP SMEAR-Modifier  02/07/2014    There are no preventive care reminders to display for this patient.  Lab Results  Component Value Date   TSH 1.490  12/31/2019   Lab Results  Component Value Date   WBC 4.9 12/31/2019   HGB 14.5 12/31/2019   HCT 43.6 12/31/2019   MCV 86 12/31/2019   PLT 207 12/31/2019   Lab Results  Component Value Date   NA 141 12/31/2019   K 4.5 12/31/2019   CO2 25 12/31/2019   GLUCOSE 112 (H) 12/31/2019   BUN 14 12/31/2019   CREATININE 0.76 12/31/2019   BILITOT 0.6 12/31/2019   ALKPHOS 86 12/31/2019   AST 21 12/31/2019   ALT 29 12/31/2019   PROT 7.2 12/31/2019   ALBUMIN 4.8 12/31/2019   CALCIUM 9.5 12/31/2019   Lab Results  Component Value Date   CHOL 164 12/31/2019   Lab Results  Component Value Date   HDL 47 12/31/2019   Lab Results  Component Value Date   LDLCALC 88 12/31/2019   Lab Results  Component Value Date   TRIG 166 (H) 12/31/2019   Lab Results  Component Value Date   CHOLHDL 3.5 12/31/2019   Lab Results  Component Value Date   HGBA1C 5.8 (H) 12/31/2019      Assessment & Plan:   Problem List Items Addressed This Visit      Other   Mixed hyperlipidemia- HA's from meds (Chronic)    -Last lipid panel: total cholesterol 164, triglycerides 166, HDL 47, LDL 88 -Continue current medication regimen. Provided refill. Last hepatic function normal. -Will repeat lipid panel and hepatic function with CPE.      Relevant Medications   rosuvastatin (CRESTOR) 10 MG tablet      Meds ordered this encounter  Medications  . rosuvastatin (CRESTOR) 10 MG tablet    Sig: Take 1 tablet (10 mg total) by mouth at bedtime.    Dispense:  90 tablet    Refill:  1    Order Specific Question:   Supervising Provider    Answer:   Beatrice Lecher D [[id number]]    Follow-up: Return in about 6 months (around 02/06/2021) for CPE and FBW few days prior .    Lorrene Reid, PA-C

## 2020-08-06 NOTE — Assessment & Plan Note (Signed)
-  Last lipid panel: total cholesterol 164, triglycerides 166, HDL 47, LDL 88 -Continue current medication regimen. Provided refill. Last hepatic function normal. -Will repeat lipid panel and hepatic function with CPE.

## 2020-08-06 NOTE — Patient Instructions (Signed)

## 2020-09-30 ENCOUNTER — Encounter: Payer: Self-pay | Admitting: Nurse Practitioner

## 2020-09-30 ENCOUNTER — Ambulatory Visit (INDEPENDENT_AMBULATORY_CARE_PROVIDER_SITE_OTHER): Payer: 59 | Admitting: Nurse Practitioner

## 2020-09-30 ENCOUNTER — Other Ambulatory Visit: Payer: Self-pay

## 2020-09-30 VITALS — BP 137/90 | HR 81 | Temp 97.5°F | Ht 67.0 in | Wt 173.7 lb

## 2020-09-30 DIAGNOSIS — H60503 Unspecified acute noninfective otitis externa, bilateral: Secondary | ICD-10-CM | POA: Diagnosis not present

## 2020-09-30 DIAGNOSIS — M62838 Other muscle spasm: Secondary | ICD-10-CM | POA: Diagnosis not present

## 2020-09-30 MED ORDER — CIPROFLOXACIN-DEXAMETHASONE 0.3-0.1 % OT SUSP: 4.0000 [drp] | Freq: Two times a day (BID) | OTIC | 0 refills | Status: DC

## 2020-09-30 NOTE — Progress Notes (Signed)
Acute Office Visit  Subjective:    Patient ID: Candice Newton, female    DOB: [birth year], 55 y.o.   MRN: [id number]  Chief Complaint  Patient presents with   Ear Pain    HPI Patient is in today for evaluation of left ear pain.  States that left ear feels full of fluid.  Pressure on the left side.  Starting to radiate to the left side of her neck.  States this has been going on for about a week.  Has taken some Tylenol 3 separate occasions since symptoms started.  Helped for short period right back.  Have history of seasonal allergies.  Takes Zyrtec.  Symptoms are generally well managed when she takes Zyrtec. Has left-sided neck stiffness and pain.  Unsure if this is related to ear pain or if this is tension related to poor posture or action.  Perhaps more to move her head from side to side or turn her head from left to right.  Denies injury or trauma.  Has been swimming and doing a lot of water aerobics recently. She denies fever, headache, chills, or nasal congestion.  She denies body aches or pains.  She denies nausea or vomiting.  Past Medical History:  Diagnosis Date   Breast mass    right   Hyperlipidemia     Past Surgical History:  Procedure Laterality Date   APPENDECTOMY     BREAST LUMPECTOMY WITH RADIOACTIVE SEED LOCALIZATION Right 04/07/2015   Procedure: RIGHT BREAST LUMPECTOMY WITH RADIOACTIVE SEED LOCALIZATION;  Surgeon: Fanny Skates, MD;  Location: [facility];  Service: General;  Laterality: Right;   BUNIONECTOMY Left    TONSILLECTOMY      Family History  Problem Relation Age of Onset   Cancer Mother        lung   Cancer Father        lung   Diabetes Brother    Hyperlipidemia Brother    Thyroid disease Brother    Esophageal cancer Brother        57 dx esophageal ca- mets from Lung ca   Heart disease Maternal Grandmother    Colon cancer Neg Hx    Rectal cancer Neg Hx    Stomach cancer Neg Hx     Social History   Socioeconomic History    Marital status: Married    Spouse name: Not on file   Number of children: 3   Years of education: Not on file   Highest education level: Not on file  Occupational History   Occupation: sales  Tobacco Use   Smoking status: Never   Smokeless tobacco: Never  Vaping Use   Vaping Use: Never used  Substance and Sexual Activity   Alcohol use: Yes    Comment: social   Drug use: No   Sexual activity: Yes    Birth control/protection: I.U.D.  Other Topics Concern   Not on file  Social History Narrative   Not on file   Social Determinants of Health   Financial Resource Strain: Not on file  Food Insecurity: Not on file  Transportation Needs: Not on file  Physical Activity: Not on file  Stress: Not on file  Social Connections: Not on file  Intimate Partner Violence: Not on file    Outpatient Medications Prior to Visit  Medication Sig Dispense Refill   Multiple Vitamin (MULTIVITAMIN) tablet Take 1 tablet by mouth daily.     rosuvastatin (CRESTOR) 10 MG tablet Take 1 tablet (10 mg  total) by mouth at bedtime. 90 tablet 1   estradiol (CLIMARA - DOSED IN MG/24 HR) 0.0375 mg/24hr patch 0.0375 mg once a week.     No facility-administered medications prior to visit.    Allergies  Allergen Reactions   Penicillins Hives and Shortness Of Breath    hives    Review of Systems  Constitutional:  Negative for activity change, chills, fatigue and fever.  HENT:  Positive for ear pain. Negative for congestion, postnasal drip, rhinorrhea, sinus pressure and sinus pain.   Eyes: Negative.   Respiratory:  Negative for cough, chest tightness and shortness of breath.   Cardiovascular:  Negative for chest pain and palpitations.  Gastrointestinal:  Negative for constipation, diarrhea, nausea and vomiting.  Endocrine: Negative.   Genitourinary: Negative.   Musculoskeletal:  Positive for neck pain and neck stiffness. Negative for back pain and myalgias.  Skin:  Negative for rash.   Allergic/Immunologic: Positive for environmental allergies.  Neurological:  Negative for dizziness, weakness and headaches.  Psychiatric/Behavioral: Negative.  The patient is not nervous/anxious.       Objective:    Physical Exam Vitals and nursing note reviewed.  Constitutional:      Appearance: Normal appearance. She is well-developed.  HENT:     Head: Normocephalic and atraumatic.     Right Ear: Tenderness present. Tympanic membrane is erythematous and bulging.     Left Ear: Tenderness present. Tympanic membrane is erythematous and bulging.     Nose: No congestion.     Right Sinus: No maxillary sinus tenderness or frontal sinus tenderness.     Left Sinus: No maxillary sinus tenderness or frontal sinus tenderness.     Mouth/Throat:     Mouth: Mucous membranes are moist.     Pharynx: Oropharynx is clear.  Eyes:     Extraocular Movements: Extraocular movements intact.     Conjunctiva/sclera: Conjunctivae normal.     Pupils: Pupils are equal, round, and reactive to light.  Cardiovascular:     Rate and Rhythm: Normal rate and regular rhythm.     Pulses: Normal pulses.     Heart sounds: Normal heart sounds.  Pulmonary:     Effort: Pulmonary effort is normal.     Breath sounds: Normal breath sounds.  Abdominal:     Palpations: Abdomen is soft.  Musculoskeletal:        General: Normal range of motion.     Cervical back: Normal range of motion and neck supple. Pain with movement and muscular tenderness present.  Lymphadenopathy:     Cervical: No cervical adenopathy.  Skin:    General: Skin is warm and dry.     Capillary Refill: Capillary refill takes less than 2 seconds.  Neurological:     General: No focal deficit present.     Mental Status: She is alert and oriented to person, place, and time.  Psychiatric:        Mood and Affect: Mood normal.        Behavior: Behavior normal.        Thought Content: Thought content normal.        Judgment: Judgment normal.     Today's Vitals   09/30/20 1120  BP: 137/90  Pulse: 81  Temp: (!) 97.5 F (36.4 C)  SpO2: 99%  Weight: 173 lb 11.2 oz (78.8 kg)  Height: 5\' 7"  (1.702 m)   Body mass index is 27.21 kg/m.   Wt Readings from Last 3 Encounters:  09/30/20 173 lb 11.2 oz (  78.8 kg)  08/06/20 173 lb 8 oz (78.7 kg)  12/31/19 175 lb 9.6 oz (79.7 kg)    Health Maintenance Due  Topic Date Due   COVID-19 Vaccine (1) Never done   Pneumococcal Vaccine 3-61 Years old (1 - PCV) Never done   PAP SMEAR-Modifier  02/07/2014    There are no preventive care reminders to display for this patient.   Lab Results  Component Value Date   TSH 1.490 12/31/2019   Lab Results  Component Value Date   WBC 4.9 12/31/2019   HGB 14.5 12/31/2019   HCT 43.6 12/31/2019   MCV 86 12/31/2019   PLT 207 12/31/2019   Lab Results  Component Value Date   NA 141 12/31/2019   K 4.5 12/31/2019   CO2 25 12/31/2019   GLUCOSE 112 (H) 12/31/2019   BUN 14 12/31/2019   CREATININE 0.76 12/31/2019   BILITOT 0.6 12/31/2019   ALKPHOS 86 12/31/2019   AST 21 12/31/2019   ALT 29 12/31/2019   PROT 7.2 12/31/2019   ALBUMIN 4.8 12/31/2019   CALCIUM 9.5 12/31/2019   Lab Results  Component Value Date   CHOL 164 12/31/2019   Lab Results  Component Value Date   HDL 47 12/31/2019   Lab Results  Component Value Date   LDLCALC 88 12/31/2019   Lab Results  Component Value Date   TRIG 166 (H) 12/31/2019   Lab Results  Component Value Date   CHOLHDL 3.5 12/31/2019   Lab Results  Component Value Date   HGBA1C 5.8 (H) 12/31/2019       Assessment & Plan:  1. Acute otitis externa of both ears, unspecified type Start Ciprodex eardrops.  Insert 4 drops into both ears twice daily for next 7 days.  Take Tylenol as needed for pain and discomfort.  She should notify the office if symptoms get worse or do not get better. - ciprofloxacin-dexamethasone (CIPRODEX) OTIC suspension; Place 4 drops into both ears 2 (two) times daily.   Dispense: 7.5 mL; Refill: 0  2. Neck muscle spasm Range of motion exercises discussed.  Written information provided to support conversation.  Encouraged her to apply heat to her neck to relax tight and sore muscles.  Will consider referral to PT if no improvement over the next week to 2 weeks.  Problem List Items Addressed This Visit       Nervous and Auditory   Acute otitis externa of both ears - Primary   Relevant Medications   ciprofloxacin-dexamethasone (CIPRODEX) OTIC suspension     Musculoskeletal and Integument   Neck muscle spasm     Meds ordered this encounter  Medications   ciprofloxacin-dexamethasone (CIPRODEX) OTIC suspension    Sig: Place 4 drops into both ears 2 (two) times daily.    Dispense:  7.5 mL    Refill:  0    Order Specific Question:   Supervising Provider    Answer:   Beatrice Lecher D [[id number]]     Ronnell Freshwater, NP

## 2020-11-18 ENCOUNTER — Telehealth: Payer: 59 | Admitting: Physician Assistant

## 2020-11-18 DIAGNOSIS — L237 Allergic contact dermatitis due to plants, except food: Secondary | ICD-10-CM | POA: Diagnosis not present

## 2020-11-18 MED ORDER — PREDNISONE 10 MG PO TABS: ORAL_TABLET | ORAL | 0 refills | Status: DC

## 2020-11-18 NOTE — Progress Notes (Signed)

## 2020-11-18 NOTE — Progress Notes (Signed)
I have spent 5 minutes in review of e-visit questionnaire, review and updating patient chart, medical decision making and response to patient.   Teshara Moree Cody Morning Halberg, PA-C    

## 2020-12-17 ENCOUNTER — Ambulatory Visit (INDEPENDENT_AMBULATORY_CARE_PROVIDER_SITE_OTHER): Payer: 59 | Admitting: Physician Assistant

## 2020-12-17 ENCOUNTER — Encounter: Payer: Self-pay | Admitting: Physician Assistant

## 2020-12-17 ENCOUNTER — Other Ambulatory Visit: Payer: Self-pay

## 2020-12-17 VITALS — BP 117/75 | HR 100 | Temp 98.1°F | Ht 67.0 in | Wt 181.5 lb

## 2020-12-17 DIAGNOSIS — J0100 Acute maxillary sinusitis, unspecified: Secondary | ICD-10-CM

## 2020-12-17 DIAGNOSIS — R051 Acute cough: Secondary | ICD-10-CM | POA: Diagnosis not present

## 2020-12-17 DIAGNOSIS — H6690 Otitis media, unspecified, unspecified ear: Secondary | ICD-10-CM | POA: Diagnosis not present

## 2020-12-17 MED ORDER — PREDNISONE 20 MG PO TABS
ORAL_TABLET | ORAL | 0 refills | Status: DC
Start: 2020-12-17 — End: 2020-12-30

## 2020-12-17 MED ORDER — AZITHROMYCIN 250 MG PO TABS: ORAL_TABLET | ORAL | 0 refills | Status: AC

## 2020-12-17 MED ORDER — BENZONATATE 100 MG PO CAPS: 200.0000 mg | ORAL_CAPSULE | Freq: Three times a day (TID) | ORAL | 0 refills | Status: DC | PRN

## 2020-12-17 NOTE — Patient Instructions (Signed)

## 2020-12-17 NOTE — Progress Notes (Signed)
Acute Office Visit  Subjective:    Patient ID: Candice Newton, female    DOB: 03/27/1965, 55 y.o.   MRN: [id number]  Chief Complaint  Patient presents with   Ear Pain    HPI Patient is in today for c/o bilateral ear pain. Started last Friday. Also has developed postnasal drainage, nasal congestion, cough, sinus pressure, and headache. No fever, n/v/d, or otorrhea. Patient was treated for swimmer's ear in June and states feels like that did not completely resolve. Started using the ear drops she was given which has helped some.  Past Medical History:  Diagnosis Date   Breast mass    right   Hyperlipidemia     Past Surgical History:  Procedure Laterality Date   APPENDECTOMY     BREAST LUMPECTOMY WITH RADIOACTIVE SEED LOCALIZATION Right 04/07/2015   Procedure: RIGHT BREAST LUMPECTOMY WITH RADIOACTIVE SEED LOCALIZATION;  Surgeon: Fanny Skates, MD;  Location: [facility];  Service: General;  Laterality: Right;   BUNIONECTOMY Left    TONSILLECTOMY      Family History  Problem Relation Age of Onset   Cancer Mother        lung   Cancer Father        lung   Diabetes Brother    Hyperlipidemia Brother    Thyroid disease Brother    Esophageal cancer Brother        54 dx esophageal ca- mets from Lung ca   Heart disease Maternal Grandmother    Colon cancer Neg Hx    Rectal cancer Neg Hx    Stomach cancer Neg Hx     Social History   Socioeconomic History   Marital status: Married    Spouse name: Not on file   Number of children: 3   Years of education: Not on file   Highest education level: Not on file  Occupational History   Occupation: sales  Tobacco Use   Smoking status: Never   Smokeless tobacco: Never  Vaping Use   Vaping Use: Never used  Substance and Sexual Activity   Alcohol use: Yes    Comment: social   Drug use: No   Sexual activity: Yes    Birth control/protection: I.U.D.  Other Topics Concern   Not on file  Social History Narrative    Not on file   Social Determinants of Health   Financial Resource Strain: Not on file  Food Insecurity: Not on file  Transportation Needs: Not on file  Physical Activity: Not on file  Stress: Not on file  Social Connections: Not on file  Intimate Partner Violence: Not on file    Outpatient Medications Prior to Visit  Medication Sig Dispense Refill   ciprofloxacin-dexamethasone (CIPRODEX) OTIC suspension Place 4 drops into both ears 2 (two) times daily. 7.5 mL 0   Multiple Vitamin (MULTIVITAMIN) tablet Take 1 tablet by mouth daily.     rosuvastatin (CRESTOR) 10 MG tablet Take 1 tablet (10 mg total) by mouth at bedtime. 90 tablet 1   No facility-administered medications prior to visit.    Allergies  Allergen Reactions   Penicillins Hives and Shortness Of Breath    hives    Review of Systems A fourteen system review of systems was performed and found to be positive as per HPI.    Objective:    Physical Exam General:  Well Developed, well nourished, appropriate for stated age.  Neuro:  Alert and oriented,  extra-ocular muscles intact  HEENT:  Normocephalic,  atraumatic, tenderness of maxillary sinus, normal conjunctiva, bulging and opaque TM's of both ears, boggy turbinates, erythematous posterior oropharynx w/o exudates, neck supple, +anterior cervical adenopathy   Skin: no gross rash, warm, pink. Cardiac: RRR, S1 S2 Respiratory: CTA B/L w/o wheezing, crackles or rales, Not using accessory muscles, speaking in full sentences- unlabored. Vascular:  Ext warm, no cyanosis apprec.; cap RF less 2 sec. Psych:  No HI/SI, judgement and insight good, Euthymic mood. Full Affect.  BP 117/75   Pulse 100   Temp 98.1 F (36.7 C)   Ht 5\' 7"  (1.702 m)   Wt 181 lb 8 oz (82.3 kg)   SpO2 97%   BMI 28.43 kg/m  Wt Readings from Last 3 Encounters:  12/17/20 181 lb 8 oz (82.3 kg)  09/30/20 173 lb 11.2 oz (78.8 kg)  08/06/20 173 lb 8 oz (78.7 kg)    Health Maintenance Due  Topic Date  Due   COVID-19 Vaccine (1) Never done   PAP SMEAR-Modifier  02/07/2014   INFLUENZA VACCINE  Never done    There are no preventive care reminders to display for this patient.   Lab Results  Component Value Date   TSH 1.490 12/31/2019   Lab Results  Component Value Date   WBC 4.9 12/31/2019   HGB 14.5 12/31/2019   HCT 43.6 12/31/2019   MCV 86 12/31/2019   PLT 207 12/31/2019   Lab Results  Component Value Date   NA 141 12/31/2019   K 4.5 12/31/2019   CO2 25 12/31/2019   GLUCOSE 112 (H) 12/31/2019   BUN 14 12/31/2019   CREATININE 0.76 12/31/2019   BILITOT 0.6 12/31/2019   ALKPHOS 86 12/31/2019   AST 21 12/31/2019   ALT 29 12/31/2019   PROT 7.2 12/31/2019   ALBUMIN 4.8 12/31/2019   CALCIUM 9.5 12/31/2019   Lab Results  Component Value Date   CHOL 164 12/31/2019   Lab Results  Component Value Date   HDL 47 12/31/2019   Lab Results  Component Value Date   LDLCALC 88 12/31/2019   Lab Results  Component Value Date   TRIG 166 (H) 12/31/2019   Lab Results  Component Value Date   CHOLHDL 3.5 12/31/2019   Lab Results  Component Value Date   HGBA1C 5.8 (H) 12/31/2019       Assessment & Plan:   Problem List Items Addressed This Visit   None Visit Diagnoses     Acute non-recurrent maxillary sinusitis    -  Primary   Relevant Medications   benzonatate (TESSALON PERLES) 100 MG capsule   azithromycin (ZITHROMAX) 250 MG tablet   predniSONE (DELTASONE) 20 MG tablet   Acute otitis media, unspecified otitis media type       Relevant Medications   azithromycin (ZITHROMAX) 250 MG tablet   Acute cough       Relevant Medications   benzonatate (TESSALON PERLES) 100 MG capsule      Patient has s/s consistent with bilateral otitis media (likely secondary to unresolved otitis externa) and sinusitis so will start antibiotic therapy and short course of corticosteroid. Recommend to use tessalon Perles as needed for cough and supportive care including a decongestant.  Follow up if symptoms fail to improve or worsen.   Meds ordered this encounter  Medications   benzonatate (TESSALON PERLES) 100 MG capsule    Sig: Take 2 capsules (200 mg total) by mouth 3 (three) times daily as needed for cough.    Dispense:  30 capsule  Refill:  0    Order Specific Question:   Supervising Provider    Answer:   Beatrice Lecher D [[id number]]   azithromycin (ZITHROMAX) 250 MG tablet    Sig: Take 2 tablets on day 1, then 1 tablet daily on days 2 through 5    Dispense:  6 tablet    Refill:  0    Order Specific Question:   Supervising Provider    Answer:   Beatrice Lecher D [[id number]]   predniSONE (DELTASONE) 20 MG tablet    Sig: Take 2 tablets by mouth x 2 days, 1 tablets x 2 days, 0.5 tablet x 2 days    Dispense:  7 tablet    Refill:  0    Order Specific Question:   Supervising Provider    Answer:   Beatrice Lecher D [[id number]]     Lorrene Reid, PA-C

## 2020-12-30 ENCOUNTER — Other Ambulatory Visit: Payer: Self-pay

## 2020-12-30 ENCOUNTER — Encounter: Payer: Self-pay | Admitting: Nurse Practitioner

## 2020-12-30 ENCOUNTER — Ambulatory Visit: Payer: 59 | Admitting: Nurse Practitioner

## 2020-12-30 VITALS — BP 121/82 | HR 72 | Temp 97.6°F | Ht 67.0 in | Wt 177.3 lb

## 2020-12-30 DIAGNOSIS — J309 Allergic rhinitis, unspecified: Secondary | ICD-10-CM | POA: Diagnosis not present

## 2020-12-30 DIAGNOSIS — H60503 Unspecified acute noninfective otitis externa, bilateral: Secondary | ICD-10-CM

## 2020-12-30 MED ORDER — CIPROFLOXACIN-DEXAMETHASONE 0.3-0.1 % OT SUSP: 4.0000 [drp] | Freq: Two times a day (BID) | OTIC | 0 refills | Status: DC

## 2020-12-30 NOTE — Progress Notes (Signed)
Acute Office Visit  Subjective:    Patient ID: Candice Newton, female    DOB: 11-23-1965, 55 y.o.   MRN: [id number]  Chief Complaint  Patient presents with   Ear Pain    The patient was treated for siialr symptoms back in July. Was treated with Ciprodex drops twice daily for a week. Symptoms did improve, but never got completely better. She states that symptoms got worse again in beginning of October. Was treated with antibiotics and prednisone taper. She states that she did get moderately better, but conitnues to have the ear fullness. She states that symptoms do improve if and when she takes Zyrtec and flonase. She continuously takes flonase, but periodically takes zyrtec. Does have fear of body getting dependant on this medication. She denies chronic ear problems. Has not had to see ENT provider in the past.   Ear Fullness  There is pain in both ears. This is a recurrent problem. The current episode started 1 to 4 weeks ago. Episode frequency: worse in the morning and gradually gets better as the day goes on. The problem has been gradually worsening. There has been no fever. The pain is mild. Associated symptoms include neck pain and rhinorrhea. Pertinent negatives include no abdominal pain, coughing, diarrhea, ear discharge, headaches, rash, sore throat or vomiting. She has tried antibiotics and acetaminophen (oral steroids and otic antibiotics) for the symptoms.    Past Medical History:  Diagnosis Date   Breast mass    right   Hyperlipidemia     Past Surgical History:  Procedure Laterality Date   APPENDECTOMY     BREAST LUMPECTOMY WITH RADIOACTIVE SEED LOCALIZATION Right 04/07/2015   Procedure: RIGHT BREAST LUMPECTOMY WITH RADIOACTIVE SEED LOCALIZATION;  Surgeon: Fanny Skates, MD;  Location: [facility];  Service: General;  Laterality: Right;   BUNIONECTOMY Left    TONSILLECTOMY      Family History  Problem Relation Age of Onset   Cancer Mother        lung    Cancer Father        lung   Diabetes Brother    Hyperlipidemia Brother    Thyroid disease Brother    Esophageal cancer Brother        4 dx esophageal ca- mets from Lung ca   Heart disease Maternal Grandmother    Colon cancer Neg Hx    Rectal cancer Neg Hx    Stomach cancer Neg Hx     Social History   Socioeconomic History   Marital status: Married    Spouse name: Not on file   Number of children: 3   Years of education: Not on file   Highest education level: Not on file  Occupational History   Occupation: sales  Tobacco Use   Smoking status: Never   Smokeless tobacco: Never  Vaping Use   Vaping Use: Never used  Substance and Sexual Activity   Alcohol use: Yes    Comment: social   Drug use: No   Sexual activity: Yes    Birth control/protection: I.U.D.  Other Topics Concern   Not on file  Social History Narrative   Not on file   Social Determinants of Health   Financial Resource Strain: Not on file  Food Insecurity: Not on file  Transportation Needs: Not on file  Physical Activity: Not on file  Stress: Not on file  Social Connections: Not on file  Intimate Partner Violence: Not on file    Outpatient Medications  Prior to Visit  Medication Sig Dispense Refill   Multiple Vitamin (MULTIVITAMIN) tablet Take 1 tablet by mouth daily.     rosuvastatin (CRESTOR) 10 MG tablet Take 1 tablet (10 mg total) by mouth at bedtime. 90 tablet 1   benzonatate (TESSALON PERLES) 100 MG capsule Take 2 capsules (200 mg total) by mouth 3 (three) times daily as needed for cough. 30 capsule 0   ciprofloxacin-dexamethasone (CIPRODEX) OTIC suspension Place 4 drops into both ears 2 (two) times daily. 7.5 mL 0   predniSONE (DELTASONE) 20 MG tablet Take 2 tablets by mouth x 2 days, 1 tablets x 2 days, 0.5 tablet x 2 days 7 tablet 0   No facility-administered medications prior to visit.    Allergies  Allergen Reactions   Penicillins Hives and Shortness Of Breath    hives    Review of  Systems  Constitutional:  Negative for activity change, appetite change, chills, fatigue and fever.  HENT:  Positive for congestion, ear pain, rhinorrhea and sinus pressure. Negative for ear discharge, postnasal drip, sinus pain, sneezing and sore throat.   Eyes: Negative.   Respiratory:  Negative for cough, chest tightness, shortness of breath and wheezing.   Cardiovascular:  Negative for chest pain and palpitations.  Gastrointestinal:  Negative for abdominal pain, constipation, diarrhea, nausea and vomiting.  Endocrine: Negative for cold intolerance, heat intolerance, polydipsia and polyuria.  Genitourinary:  Negative for dyspareunia, dysuria, flank pain, frequency and urgency.  Musculoskeletal:  Positive for myalgias and neck pain. Negative for arthralgias and back pain.  Skin:  Negative for rash.  Allergic/Immunologic: Positive for environmental allergies.  Neurological:  Negative for dizziness, weakness and headaches.  Hematological:  Negative for adenopathy.  Psychiatric/Behavioral:  The patient is not nervous/anxious.       Objective:    Physical Exam Vitals and nursing note reviewed.  Constitutional:      Appearance: Normal appearance. She is well-developed.  HENT:     Head: Normocephalic and atraumatic.     Right Ear: Swelling present. Tympanic membrane is bulging.     Left Ear: Swelling present. Tympanic membrane is bulging.     Nose: Congestion present.     Mouth/Throat:     Mouth: Mucous membranes are moist.     Pharynx: Oropharynx is clear.  Eyes:     Extraocular Movements: Extraocular movements intact.     Conjunctiva/sclera: Conjunctivae normal.     Pupils: Pupils are equal, round, and reactive to light.  Cardiovascular:     Rate and Rhythm: Normal rate and regular rhythm.     Pulses: Normal pulses.     Heart sounds: Normal heart sounds.  Pulmonary:     Effort: Pulmonary effort is normal.     Breath sounds: Normal breath sounds.  Abdominal:     Palpations:  Abdomen is soft.  Musculoskeletal:        General: Normal range of motion.     Cervical back: Normal range of motion and neck supple.  Lymphadenopathy:     Cervical: No cervical adenopathy.  Skin:    General: Skin is warm and dry.     Capillary Refill: Capillary refill takes less than 2 seconds.  Neurological:     General: No focal deficit present.     Mental Status: She is alert and oriented to person, place, and time.  Psychiatric:        Mood and Affect: Mood normal.        Behavior: Behavior normal.  Thought Content: Thought content normal.        Judgment: Judgment normal.   Today's Vitals   12/30/20 1305  BP: 121/82  Pulse: 72  Temp: 97.6 F (36.4 C)  SpO2: 99%  Weight: 177 lb 4.8 oz (80.4 kg)  Height: 5\' 7"  (1.702 m)   Body mass index is 27.77 kg/m.   Wt Readings from Last 3 Encounters:  12/30/20 177 lb 4.8 oz (80.4 kg)  12/17/20 181 lb 8 oz (82.3 kg)  09/30/20 173 lb 11.2 oz (78.8 kg)    Health Maintenance Due  Topic Date Due   COVID-19 Vaccine (1) Never done   PAP SMEAR-Modifier  02/07/2014   INFLUENZA VACCINE  Never done    There are no preventive care reminders to display for this patient.   Lab Results  Component Value Date   TSH 1.490 12/31/2019   Lab Results  Component Value Date   WBC 4.9 12/31/2019   HGB 14.5 12/31/2019   HCT 43.6 12/31/2019   MCV 86 12/31/2019   PLT 207 12/31/2019   Lab Results  Component Value Date   NA 141 12/31/2019   K 4.5 12/31/2019   CO2 25 12/31/2019   GLUCOSE 112 (H) 12/31/2019   BUN 14 12/31/2019   CREATININE 0.76 12/31/2019   BILITOT 0.6 12/31/2019   ALKPHOS 86 12/31/2019   AST 21 12/31/2019   ALT 29 12/31/2019   PROT 7.2 12/31/2019   ALBUMIN 4.8 12/31/2019   CALCIUM 9.5 12/31/2019   Lab Results  Component Value Date   CHOL 164 12/31/2019   Lab Results  Component Value Date   HDL 47 12/31/2019   Lab Results  Component Value Date   LDLCALC 88 12/31/2019   Lab Results  Component  Value Date   TRIG 166 (H) 12/31/2019   Lab Results  Component Value Date   CHOLHDL 3.5 12/31/2019   Lab Results  Component Value Date   HGBA1C 5.8 (H) 12/31/2019       Assessment & Plan:  1. Acute otitis externa of both ears, unspecified type Repeat dosing with ciprodex ear drops. Insert four drops in both ears twice daily for next week.  - ciprofloxacin-dexamethasone (CIPRODEX) OTIC suspension; Place 4 drops into the right ear 2 (two) times daily.  Dispense: 7.5 mL; Refill: 0  2. Chronic allergic rhinitis Recommend she take syrtec 10mg  daily. Continue to use flonase nasal spray every day. Reassess at next visit. Refer to ENT as indicated.    Problem List Items Addressed This Visit       Respiratory   Chronic allergic rhinitis     Nervous and Auditory   Acute otitis externa of both ears - Primary   Relevant Medications   ciprofloxacin-dexamethasone (CIPRODEX) OTIC suspension     Meds ordered this encounter  Medications   ciprofloxacin-dexamethasone (CIPRODEX) OTIC suspension    Sig: Place 4 drops into the right ear 2 (two) times daily.    Dispense:  7.5 mL    Refill:  0    Order Specific Question:   Supervising Provider    Answer:   Beatrice Lecher D [[id number]]     Ronnell Freshwater, NP

## 2021-01-05 ENCOUNTER — Other Ambulatory Visit: Payer: Self-pay

## 2021-01-05 ENCOUNTER — Encounter: Payer: Self-pay | Admitting: Physician Assistant

## 2021-01-05 ENCOUNTER — Ambulatory Visit (INDEPENDENT_AMBULATORY_CARE_PROVIDER_SITE_OTHER): Payer: 59 | Admitting: Physician Assistant

## 2021-01-05 VITALS — BP 116/81 | HR 97 | Temp 98.0°F | Ht 67.0 in | Wt 175.0 lb

## 2021-01-05 DIAGNOSIS — H60503 Unspecified acute noninfective otitis externa, bilateral: Secondary | ICD-10-CM | POA: Diagnosis not present

## 2021-01-05 DIAGNOSIS — E782 Mixed hyperlipidemia: Secondary | ICD-10-CM

## 2021-01-05 DIAGNOSIS — Z0000 Encounter for general adult medical examination without abnormal findings: Secondary | ICD-10-CM

## 2021-01-05 DIAGNOSIS — Z130 Encounter for screening for diseases of the blood and blood-forming organs and certain disorders involving the immune mechanism: Secondary | ICD-10-CM

## 2021-01-05 MED ORDER — ROSUVASTATIN CALCIUM 10 MG PO TABS: 10.0000 mg | ORAL_TABLET | Freq: Every day | ORAL | 3 refills | Status: DC

## 2021-01-05 NOTE — Patient Instructions (Signed)
Preventive Care 40-54 Years Old, Female Preventive care refers to lifestyle choices and visits with your health care provider that can promote health and wellness. This includes: A yearly physical exam. This is also called an annual wellness visit. Regular dental and eye exams. Immunizations. Screening for certain conditions. Healthy lifestyle choices, such as: Eating a healthy diet. Getting regular exercise. Not using drugs or products that contain nicotine and tobacco. Limiting alcohol use. What can I expect for my preventive care visit? Physical exam Your health care provider will check your: Height and weight. These may be used to calculate your BMI (body mass index). BMI is a measurement that tells if you are at a healthy weight. Heart rate and blood pressure. Body temperature. Skin for abnormal spots. Counseling Your health care provider may ask you questions about your: Past medical problems. Family's medical history. Alcohol, tobacco, and drug use. Emotional well-being. Home life and relationship well-being. Sexual activity. Diet, exercise, and sleep habits. Work and work environment. Access to firearms. Method of birth control. Menstrual cycle. Pregnancy history. What immunizations do I need? Vaccines are usually given at various ages, according to a schedule. Your health care provider will recommend vaccines for you based on your age, medical history, and lifestyle or other factors, such as travel or where you work. What tests do I need? Blood tests Lipid and cholesterol levels. These may be checked every 5 years, or more often if you are over 54 years old. Hepatitis C test. Hepatitis B test. Screening Lung cancer screening. You may have this screening every year starting at age 54 if you have a 30-pack-year history of smoking and currently smoke or have quit within the past 15 years. Colorectal cancer screening. All adults should have this screening starting at  age 54 and continuing until age 75. Your health care provider may recommend screening at age 54 if you are at increased risk. You will have tests every 1-10 years, depending on your results and the type of screening test. Diabetes screening. This is done by checking your blood sugar (glucose) after you have not eaten for a while (fasting). You may have this done every 1-3 years. Mammogram. This may be done every 1-2 years. Talk with your health care provider about when you should start having regular mammograms. This may depend on whether you have a family history of breast cancer. BRCA-related cancer screening. This may be done if you have a family history of breast, ovarian, tubal, or peritoneal cancers. Pelvic exam and Pap test. This may be done every 3 years starting at age 54. Starting at age 54, this may be done every 5 years if you have a Pap test in combination with an HPV test. Other tests STD (sexually transmitted disease) testing, if you are at risk. Bone density scan. This is done to screen for osteoporosis. You may have this scan if you are at high risk for osteoporosis. Talk with your health care provider about your test results, treatment options, and if necessary, the need for more tests. Follow these instructions at home: Eating and drinking  Eat a diet that includes fresh fruits and vegetables, whole grains, lean protein, and low-fat dairy products. Take vitamin and mineral supplements as recommended by your health care provider. Do not drink alcohol if: Your health care provider tells you not to drink. You are pregnant, may be pregnant, or are planning to become pregnant. If you drink alcohol: Limit how much you have to 0-1 drink a day. Be   aware of how much alcohol is in your drink. In the [place] one drink equals one 12 oz bottle of beer (355 mL), one 5 oz glass of wine (148 mL), or one 1 oz glass of hard liquor (44 mL). Lifestyle Take daily care of your teeth and  gums. Brush your teeth every morning and night with fluoride toothpaste. Floss one time each day. Stay active. Exercise for at least 30 minutes 5 or more days each week. Do not use any products that contain nicotine or tobacco, such as cigarettes, e-cigarettes, and chewing tobacco. If you need help quitting, ask your health care provider. Do not use drugs. If you are sexually active, practice safe sex. Use a condom or other form of protection to prevent STIs (sexually transmitted infections). If you do not wish to become pregnant, use a form of birth control. If you plan to become pregnant, see your health care provider for a prepregnancy visit. If told by your health care provider, take low-dose aspirin daily starting at age 54. Find healthy ways to cope with stress, such as: Meditation, yoga, or listening to music. Journaling. Talking to a trusted person. Spending time with friends and family. Safety Always wear your seat belt while driving or riding in a vehicle. Do not drive: If you have been drinking alcohol. Do not ride with someone who has been drinking. When you are tired or distracted. While texting. Wear a helmet and other protective equipment during sports activities. If you have firearms in your house, make sure you follow all gun safety procedures. What's next? Visit your health care provider once a year for an annual wellness visit. Ask your health care provider how often you should have your eyes and teeth checked. Stay up to date on all vaccines. This information is not intended to replace advice given to you by your health care provider. Make sure you discuss any questions you have with your health care provider. Document Revised: 05/09/2020 Document Reviewed: 11/10/2017 Elsevier Patient Education  2022 [employer].

## 2021-01-05 NOTE — Progress Notes (Signed)
Subjective:     Candice Newton is a 55 y.o. female and is here for a comprehensive physical exam. The patient reports no problems.  Social History   Socioeconomic History   Marital status: Married    Spouse name: Not on file   Number of children: 3   Years of education: Not on file   Highest education level: Not on file  Occupational History   Occupation: sales  Tobacco Use   Smoking status: Never   Smokeless tobacco: Never  Vaping Use   Vaping Use: Never used  Substance and Sexual Activity   Alcohol use: Yes    Comment: social   Drug use: No   Sexual activity: Yes    Birth control/protection: I.U.D.  Other Topics Concern   Not on file  Social History Narrative   Not on file   Social Determinants of Health   Financial Resource Strain: Not on file  Food Insecurity: Not on file  Transportation Needs: Not on file  Physical Activity: Not on file  Stress: Not on file  Social Connections: Not on file  Intimate Partner Violence: Not on file   Health Maintenance  Topic Date Due   COVID-19 Vaccine (1) Never done   Pneumococcal Vaccine 44-61 Years old (1 - PCV) Never done   Hepatitis C Screening  Never done   PAP SMEAR-Modifier  02/07/2014   INFLUENZA VACCINE  Never done   HIV Screening  09/06/2028 (Originally 06/25/1980)   MAMMOGRAM  10/08/2021   COLONOSCOPY (Pts 45-76yrs Insurance coverage will need to be confirmed)  12/16/2027   TETANUS/TDAP  09/04/2028   Zoster Vaccines- Shingrix  Completed   HPV VACCINES  Aged Out    The following portions of the patient's history were reviewed and updated as appropriate: allergies, current medications, past family history, past medical history, past social history, past surgical history, and problem list.  Review of Systems Pertinent items noted in HPI and remainder of comprehensive ROS otherwise negative.   Objective:    BP 116/81   Pulse 97   Temp 98 F (36.7 C)   Ht 5\' 7"  (1.702 m)   Wt 175 lb (79.4 kg)   SpO2 98%    BMI 27.41 kg/m  General appearance: alert, cooperative, and no distress Head: Normocephalic, without obvious abnormality, atraumatic Eyes: conjunctivae/corneas clear. PERRL, EOM's intact. Fundi benign. Ears: normal TM and external ear canal right ear and abnormal TM left ear - mild serous fluid Nose: no discharge, sinus tenderness maxillary and ethmoid Throat: lips, mucosa, and tongue normal; teeth and gums normal Neck: no adenopathy, no JVD, supple, symmetrical, trachea midline, and thyroid: normal to inspection and palpation Back: symmetric, no curvature. ROM normal. No CVA tenderness. Lungs: clear to auscultation bilaterally Heart: regular rate and rhythm, S1, S2 normal, no murmur, click, rub or gallop Abdomen: soft, non-tender; bowel sounds normal; no masses,  no organomegaly Extremities: extremities normal, atraumatic, no cyanosis or edema Pulses: 2+ and symmetric Skin: Skin color, texture, turgor normal. No rashes or lesions Lymph nodes: Cervical adenopathy: normal and Supraclavicular adenopathy: normal Neurologic: Grossly normal    Assessment:    Healthy female exam.     Plan:  -Patient will schedule lab visit for FBW. Declined Hep C screening and influenza.  -Will request OB/GYN records for most recent pap.  -Patient plans to schedule mammogram, states received reminder letter. -Will place ENT referral for chronic AOE/ETD. -Recommend to stay well hydrated, follow a heart healthy diet and continue to stay as  active as possible. -Follow up in 1 year for CPE and FBW.   See After Visit Summary for Counseling Recommendations

## 2021-01-06 ENCOUNTER — Other Ambulatory Visit: Payer: 59

## 2021-01-06 DIAGNOSIS — Z130 Encounter for screening for diseases of the blood and blood-forming organs and certain disorders involving the immune mechanism: Secondary | ICD-10-CM

## 2021-01-06 DIAGNOSIS — Z1321 Encounter for screening for nutritional disorder: Secondary | ICD-10-CM

## 2021-01-06 DIAGNOSIS — Z0000 Encounter for general adult medical examination without abnormal findings: Secondary | ICD-10-CM

## 2021-01-07 LAB — COMPREHENSIVE METABOLIC PANEL
ALT: 22 IU/L (ref 0–32)
AST: 24 IU/L (ref 0–40)
Albumin/Globulin Ratio: 2 (ref 1.2–2.2)
Albumin: 4.7 g/dL (ref 3.8–4.9)
Alkaline Phosphatase: 81 IU/L (ref 44–121)
BUN/Creatinine Ratio: 16 (ref 9–23)
BUN: 11 mg/dL (ref 6–24)
Bilirubin Total: 0.7 mg/dL (ref 0.0–1.2)
CO2: 22 mmol/L (ref 20–29)
Calcium: 9.8 mg/dL (ref 8.7–10.2)
Chloride: 100 mmol/L (ref 96–106)
Creatinine, Ser: 0.7 mg/dL (ref 0.57–1.00)
Globulin, Total: 2.3 g/dL (ref 1.5–4.5)
Glucose: 106 mg/dL — ABNORMAL HIGH (ref 70–99)
Potassium: 4.1 mmol/L (ref 3.5–5.2)
Sodium: 138 mmol/L (ref 134–144)
Total Protein: 7 g/dL (ref 6.0–8.5)
eGFR: 103 mL/min/{1.73_m2} (ref >59)

## 2021-01-07 LAB — CBC WITH DIFFERENTIAL/PLATELET
Basophils Absolute: 0 10*3/uL (ref 0.0–0.2)
Basos: 1 %
EOS (ABSOLUTE): 0.1 10*3/uL (ref 0.0–0.4)
Eos: 2 %
Hematocrit: 42 % (ref 34.0–46.6)
Hemoglobin: 14.1 g/dL (ref 11.1–15.9)
Immature Grans (Abs): 0 10*3/uL (ref 0.0–0.1)
Immature Granulocytes: 0 %
Lymphocytes Absolute: 1.5 10*3/uL (ref 0.7–3.1)
Lymphs: 33 %
MCH: 28.6 pg (ref 26.6–33.0)
MCHC: 33.6 g/dL (ref 31.5–35.7)
MCV: 85 fL (ref 79–97)
Monocytes Absolute: 0.5 10*3/uL (ref 0.1–0.9)
Monocytes: 11 %
Neutrophils Absolute: 2.4 10*3/uL (ref 1.4–7.0)
Neutrophils: 53 %
Platelets: 198 10*3/uL (ref 150–450)
RBC: 4.93 x10E6/uL (ref 3.77–5.28)
RDW: 13.1 % (ref 11.7–15.4)
WBC: 4.6 10*3/uL (ref 3.4–10.8)

## 2021-01-07 LAB — LIPID PANEL
Chol/HDL Ratio: 3.9 ratio (ref 0.0–4.4)
Cholesterol, Total: 173 mg/dL (ref 100–199)
HDL: 44 mg/dL (ref >39)
LDL Chol Calc (NIH): 107 mg/dL — ABNORMAL HIGH (ref 0–99)
Triglycerides: 124 mg/dL (ref 0–149)
VLDL Cholesterol Cal: 22 mg/dL (ref 5–40)

## 2021-01-07 LAB — TSH: TSH: 1.68 u[IU]/mL (ref 0.450–4.500)

## 2021-01-07 LAB — HEMOGLOBIN A1C
Est. average glucose Bld gHb Est-mCnc: 120 mg/dL
Hgb A1c MFr Bld: 5.8 % — ABNORMAL HIGH (ref 4.8–5.6)

## 2021-03-18 ENCOUNTER — Encounter: Payer: Self-pay | Admitting: Physician Assistant

## 2021-06-09 ENCOUNTER — Ambulatory Visit: Payer: 59 | Admitting: Physician Assistant

## 2022-01-12 ENCOUNTER — Other Ambulatory Visit: Payer: 59

## 2022-01-12 ENCOUNTER — Other Ambulatory Visit: Payer: Self-pay

## 2022-01-12 ENCOUNTER — Encounter: Payer: 59 | Admitting: Physician Assistant

## 2022-01-12 DIAGNOSIS — Z0000 Encounter for general adult medical examination without abnormal findings: Secondary | ICD-10-CM

## 2022-01-12 DIAGNOSIS — Z1329 Encounter for screening for other suspected endocrine disorder: Secondary | ICD-10-CM

## 2022-01-13 ENCOUNTER — Encounter: Payer: 59 | Admitting: Physician Assistant

## 2022-01-13 LAB — CBC WITH DIFFERENTIAL/PLATELET
Basophils Absolute: 0.1 10*3/uL (ref 0.0–0.2)
Basos: 1 %
EOS (ABSOLUTE): 0.1 10*3/uL (ref 0.0–0.4)
Eos: 3 %
Hematocrit: 43.2 % (ref 34.0–46.6)
Hemoglobin: 14.5 g/dL (ref 11.1–15.9)
Immature Grans (Abs): 0 10*3/uL (ref 0.0–0.1)
Immature Granulocytes: 1 %
Lymphocytes Absolute: 1.4 10*3/uL (ref 0.7–3.1)
Lymphs: 32 %
MCH: 28.4 pg (ref 26.6–33.0)
MCHC: 33.6 g/dL (ref 31.5–35.7)
MCV: 85 fL (ref 79–97)
Monocytes Absolute: 0.4 10*3/uL (ref 0.1–0.9)
Monocytes: 10 %
Neutrophils Absolute: 2.3 10*3/uL (ref 1.4–7.0)
Neutrophils: 53 %
Platelets: 246 10*3/uL (ref 150–450)
RBC: 5.11 x10E6/uL (ref 3.77–5.28)
RDW: 13.6 % (ref 11.7–15.4)
WBC: 4.2 10*3/uL (ref 3.4–10.8)

## 2022-01-13 LAB — COMPREHENSIVE METABOLIC PANEL
ALT: 23 IU/L (ref 0–32)
AST: 18 IU/L (ref 0–40)
Albumin/Globulin Ratio: 1.9 (ref 1.2–2.2)
Albumin: 4.6 g/dL (ref 3.8–4.9)
Alkaline Phosphatase: 82 IU/L (ref 44–121)
BUN/Creatinine Ratio: 14 (ref 9–23)
BUN: 11 mg/dL (ref 6–24)
Bilirubin Total: 0.5 mg/dL (ref 0.0–1.2)
CO2: 24 mmol/L (ref 20–29)
Calcium: 9.4 mg/dL (ref 8.7–10.2)
Chloride: 104 mmol/L (ref 96–106)
Creatinine, Ser: 0.79 mg/dL (ref 0.57–1.00)
Globulin, Total: 2.4 g/dL (ref 1.5–4.5)
Glucose: 103 mg/dL — ABNORMAL HIGH (ref 70–99)
Potassium: 4.8 mmol/L (ref 3.5–5.2)
Sodium: 141 mmol/L (ref 134–144)
Total Protein: 7 g/dL (ref 6.0–8.5)
eGFR: 88 mL/min/{1.73_m2} (ref >59)

## 2022-01-13 LAB — HEMOGLOBIN A1C
Est. average glucose Bld gHb Est-mCnc: 120 mg/dL
Hgb A1c MFr Bld: 5.8 % — ABNORMAL HIGH (ref 4.8–5.6)

## 2022-01-13 LAB — LIPID PANEL
Chol/HDL Ratio: 6.9 ratio — ABNORMAL HIGH (ref 0.0–4.4)
Cholesterol, Total: 290 mg/dL — ABNORMAL HIGH (ref 100–199)
HDL: 42 mg/dL (ref >39)
LDL Chol Calc (NIH): 204 mg/dL — ABNORMAL HIGH (ref 0–99)
Triglycerides: 225 mg/dL — ABNORMAL HIGH (ref 0–149)
VLDL Cholesterol Cal: 44 mg/dL — ABNORMAL HIGH (ref 5–40)

## 2022-01-13 LAB — TSH: TSH: 1.59 u[IU]/mL (ref 0.450–4.500)

## 2022-01-21 ENCOUNTER — Encounter: Payer: Self-pay | Admitting: Physician Assistant

## 2022-01-21 ENCOUNTER — Ambulatory Visit (INDEPENDENT_AMBULATORY_CARE_PROVIDER_SITE_OTHER): Payer: 59 | Admitting: Physician Assistant

## 2022-01-21 VITALS — BP 129/85 | HR 75 | Resp 18 | Ht 67.0 in | Wt 175.0 lb

## 2022-01-21 DIAGNOSIS — E663 Overweight: Secondary | ICD-10-CM

## 2022-01-21 DIAGNOSIS — H6990 Unspecified Eustachian tube disorder, unspecified ear: Secondary | ICD-10-CM

## 2022-01-21 DIAGNOSIS — Z0000 Encounter for general adult medical examination without abnormal findings: Secondary | ICD-10-CM

## 2022-01-21 DIAGNOSIS — E782 Mixed hyperlipidemia: Secondary | ICD-10-CM

## 2022-01-21 DIAGNOSIS — Z6827 Body mass index (BMI) 27.0-27.9, adult: Secondary | ICD-10-CM

## 2022-01-21 DIAGNOSIS — R7303 Prediabetes: Secondary | ICD-10-CM | POA: Diagnosis not present

## 2022-01-21 DIAGNOSIS — J309 Allergic rhinitis, unspecified: Secondary | ICD-10-CM

## 2022-01-21 NOTE — Patient Instructions (Signed)
Preventive Care 40-55 Years Old, Female Preventive care refers to lifestyle choices and visits with your health care provider that can promote health and wellness. Preventive care visits are also called wellness exams. What can I expect for my preventive care visit? Counseling Your health care provider may ask you questions about your: Medical history, including: Past medical problems. Family medical history. Pregnancy history. Current health, including: Menstrual cycle. Method of birth control. Emotional well-being. Home life and relationship well-being. Sexual activity and sexual health. Lifestyle, including: Alcohol, nicotine or tobacco, and drug use. Access to firearms. Diet, exercise, and sleep habits. Work and work environment. Sunscreen use. Safety issues such as seatbelt and bike helmet use. Physical exam Your health care provider will check your: Height and weight. These may be used to calculate your BMI (body mass index). BMI is a measurement that tells if you are at a healthy weight. Waist circumference. This measures the distance around your waistline. This measurement also tells if you are at a healthy weight and may help predict your risk of certain diseases, such as type 2 diabetes and high blood pressure. Heart rate and blood pressure. Body temperature. Skin for abnormal spots. What immunizations do I need?  Vaccines are usually given at various ages, according to a schedule. Your health care provider will recommend vaccines for you based on your age, medical history, and lifestyle or other factors, such as travel or where you work. What tests do I need? Screening Your health care provider may recommend screening tests for certain conditions. This may include: Lipid and cholesterol levels. Diabetes screening. This is done by checking your blood sugar (glucose) after you have not eaten for a while (fasting). Pelvic exam and Pap test. Hepatitis B test. Hepatitis C  test. HIV (human immunodeficiency virus) test. STI (sexually transmitted infection) testing, if you are at risk. Lung cancer screening. Colorectal cancer screening. Mammogram. Talk with your health care provider about when you should start having regular mammograms. This may depend on whether you have a family history of breast cancer. BRCA-related cancer screening. This may be done if you have a family history of breast, ovarian, tubal, or peritoneal cancers. Bone density scan. This is done to screen for osteoporosis. Talk with your health care provider about your test results, treatment options, and if necessary, the need for more tests. Follow these instructions at home: Eating and drinking  Eat a diet that includes fresh fruits and vegetables, whole grains, lean protein, and low-fat dairy products. Take vitamin and mineral supplements as recommended by your health care provider. Do not drink alcohol if: Your health care provider tells you not to drink. You are pregnant, may be pregnant, or are planning to become pregnant. If you drink alcohol: Limit how much you have to 0-1 drink a day. Know how much alcohol is in your drink. In the [place] one drink equals one 12 oz bottle of beer (355 mL), one 5 oz glass of wine (148 mL), or one 1 oz glass of hard liquor (44 mL). Lifestyle Brush your teeth every morning and night with fluoride toothpaste. Floss one time each day. Exercise for at least 30 minutes 5 or more days each week. Do not use any products that contain nicotine or tobacco. These products include cigarettes, chewing tobacco, and vaping devices, such as e-cigarettes. If you need help quitting, ask your health care provider. Do not use drugs. If you are sexually active, practice safe sex. Use a condom or other form of protection to   prevent STIs. If you do not wish to become pregnant, use a form of birth control. If you plan to become pregnant, see your health care provider for a  prepregnancy visit. Take aspirin only as told by your health care provider. Make sure that you understand how much to take and what form to take. Work with your health care provider to find out whether it is safe and beneficial for you to take aspirin daily. Find healthy ways to manage stress, such as: Meditation, yoga, or listening to music. Journaling. Talking to a trusted person. Spending time with friends and family. Minimize exposure to UV radiation to reduce your risk of skin cancer. Safety Always wear your seat belt while driving or riding in a vehicle. Do not drive: If you have been drinking alcohol. Do not ride with someone who has been drinking. When you are tired or distracted. While texting. If you have been using any mind-altering substances or drugs. Wear a helmet and other protective equipment during sports activities. If you have firearms in your house, make sure you follow all gun safety procedures. Seek help if you have been physically or sexually abused. What's next? Visit your health care provider once a year for an annual wellness visit. Ask your health care provider how often you should have your eyes and teeth checked. Stay up to date on all vaccines. This information is not intended to replace advice given to you by your health care provider. Make sure you discuss any questions you have with your health care provider. Document Revised: 08/27/2020 Document Reviewed: 08/27/2020 Elsevier Patient Education  [employer].

## 2022-01-21 NOTE — Progress Notes (Signed)
Complete physical exam   Patient: Candice Newton   DOB: Nov 04, 1965   56 y.o. Female  MRN: [id number] Visit Date: 01/21/2022   Chief Complaint  Patient presents with   Annual Exam   Subjective    Candice Newton is a 56 y.o. female who presents today for a complete physical exam.  She reports consuming a  general  diet.  Walking and riding bicycle for 30 minutes daily.  She generally feels fairly well. She does not have additional problems to discuss today.    Past Medical History:  Diagnosis Date   Breast mass    right   Hyperlipidemia    Past Surgical History:  Procedure Laterality Date   APPENDECTOMY     BREAST LUMPECTOMY WITH RADIOACTIVE SEED LOCALIZATION Right 04/07/2015   Procedure: RIGHT BREAST LUMPECTOMY WITH RADIOACTIVE SEED LOCALIZATION;  Surgeon: Fanny Skates, MD;  Location: [facility];  Service: General;  Laterality: Right;   BUNIONECTOMY Left    TONSILLECTOMY     Social History   Socioeconomic History   Marital status: Married    Spouse name: Not on file   Number of children: 3   Years of education: Not on file   Highest education level: Not on file  Occupational History   Occupation: sales  Tobacco Use   Smoking status: Never   Smokeless tobacco: Never  Vaping Use   Vaping Use: Never used  Substance and Sexual Activity   Alcohol use: Yes    Comment: social   Drug use: No   Sexual activity: Yes    Birth control/protection: I.U.D.  Other Topics Concern   Not on file  Social History Narrative   Not on file   Social Determinants of Health   Financial Resource Strain: Not on file  Food Insecurity: Not on file  Transportation Needs: Not on file  Physical Activity: Not on file  Stress: Not on file  Social Connections: Not on file  Intimate Partner Violence: Not on file     Medications: Outpatient Medications Prior to Visit  Medication Sig   [DISCONTINUED] ciprofloxacin-dexamethasone (CIPRODEX) OTIC suspension Place 4 drops  into the right ear 2 (two) times daily. (Patient not taking: Reported on 01/21/2022)   [DISCONTINUED] Multiple Vitamin (MULTIVITAMIN) tablet Take 1 tablet by mouth daily. (Patient not taking: Reported on 01/21/2022)   [DISCONTINUED] rosuvastatin (CRESTOR) 10 MG tablet Take 1 tablet (10 mg total) by mouth at bedtime. (Patient not taking: Reported on 01/21/2022)   No facility-administered medications prior to visit.    Review of Systems Review of Systems:  A fourteen system review of systems was performed and found to be positive as per HPI.  Last CBC Lab Results  Component Value Date   WBC 4.2 01/12/2022   HGB 14.5 01/12/2022   HCT 43.2 01/12/2022   MCV 85 01/12/2022   MCH 28.4 01/12/2022   RDW 13.6 01/12/2022   PLT 246 28/63/8177   Last metabolic panel Lab Results  Component Value Date   GLUCOSE 103 (H) 01/12/2022   NA 141 01/12/2022   K 4.8 01/12/2022   CL 104 01/12/2022   CO2 24 01/12/2022   BUN 11 01/12/2022   CREATININE 0.79 01/12/2022   EGFR 88 01/12/2022   CALCIUM 9.4 01/12/2022   PROT 7.0 01/12/2022   ALBUMIN 4.6 01/12/2022   LABGLOB 2.4 01/12/2022   AGRATIO 1.9 01/12/2022   BILITOT 0.5 01/12/2022   ALKPHOS 82 01/12/2022   AST 18 01/12/2022   ALT 23  01/12/2022   Last lipids Lab Results  Component Value Date   CHOL 290 (H) 01/12/2022   HDL 42 01/12/2022   LDLCALC 204 (H) 01/12/2022   TRIG 225 (H) 01/12/2022   CHOLHDL 6.9 (H) 01/12/2022   Last hemoglobin A1c Lab Results  Component Value Date   HGBA1C 5.8 (H) 01/12/2022   Last thyroid functions Lab Results  Component Value Date   TSH 1.590 01/12/2022      Objective    BP 129/85 (BP Location: Right Arm, Patient Position: Sitting, Cuff Size: Large)   Pulse 75   Resp 18   Ht _0  (1.702 m)   Wt 175 lb (79.4 kg)   SpO2 100%   BMI 27.41 kg/m  BP Readings from Last 3 Encounters:  01/21/22 129/85  01/05/21 116/81  12/30/20 121/82   Wt Readings from Last 3 Encounters:  01/21/22 175 lb (79.4 kg)   01/05/21 175 lb (79.4 kg)  12/30/20 177 lb 4.8 oz (80.4 kg)      Physical Exam   General Appearance:       Alert, cooperative, in no acute distress, appears stated age   Head:    Normocephalic, without obvious abnormality, atraumatic  Eyes:    PERRL, conjunctiva/corneas clear, EOM's intact, fundi    benign, both eyes  Ears:    Normal TM's and external ear canals, both ears  Nose:   Nares normal, septum midline, mucosa normal, no drainage    or sinus tenderness  Throat:   Lips, mucosa, and tongue normal; teeth and gums normal  Neck:   Supple, symmetrical, trachea midline, no adenopathy;    thyroid:  no enlargement/tenderness/nodules; no JVD  Back:     Symmetric, no curvature, ROM normal, no CVA tenderness  Lungs:     Clear to auscultation bilaterally, respirations unlabored  Chest Wall:    No tenderness or deformity   Heart:    Normal heart rate. Normal rhythm. No murmurs, rubs, or gallops.   Breast Exam:    deferred  Abdomen:     Soft, non-tender, bowel sounds active all four quadrants,    no masses, no organomegaly  Pelvic:    deferred  Extremities:   All extremities are intact. No cyanosis or edema  Pulses:   2+ and symmetric all extremities  Skin:   Skin color, texture, turgor normal, no rashes or suspicious lesions  Lymph nodes:   Cervical, supraclavicular nodes normal  Neurologic:   CNII-XII grossly intact.     Last depression screening scores    01/21/2022    9:31 AM 01/05/2021    3:21 PM 12/30/2020    1:07 PM  PHQ 2/9 Scores  PHQ - 2 Score 0 0 0  PHQ- 9 Score 0 0 0   Last fall risk screening    01/21/2022    9:32 AM  [place] in the past year? 0  Number falls in past yr: 0  Injury with Fall? 0     No results found for any visits on 01/21/22.  Assessment & Plan    Routine Health Maintenance and Physical Exam  Exercise Activities and Dietary recommendations -Discussed heart healthy diet low in fat and carbohydrates. Recommend moderate  exercise 150 mins/wk.  Immunization History  Administered Date(s) Administered   Tdap 08/06/2008, 09/05/2018   Zoster Recombinat (Shingrix) 12/31/2019, 03/03/2020    Health Maintenance  Topic Date Due   COVID-19 Vaccine (1) Never done   Hepatitis C Screening  Never done  PAP SMEAR-Modifier  09/10/2021   MAMMOGRAM  10/08/2021   INFLUENZA VACCINE  06/13/2022 (Originally 10/13/2021)   HIV Screening  09/06/2028 (Originally 06/25/1981)   COLONOSCOPY (Pts 45-38yr Insurance coverage will need to be confirmed)  12/16/2027   TETANUS/TDAP  09/04/2028   Zoster Vaccines- Shingrix  Completed   HPV VACCINES  Aged Out    Discussed health benefits of physical activity, and encouraged her to engage in regular exercise appropriate for her age and condition.  Problem List Items Addressed This Visit       Respiratory   Chronic allergic rhinitis   Relevant Orders   Ambulatory referral to ENT     Other   Mixed hyperlipidemia- HA's from meds (Chronic)   Relevant Orders   Amb ref to Medical Nutrition Therapy-MNT   Other Visit Diagnoses     Healthcare maintenance    -  Primary   Overweight with body mass index (BMI) of 27 to 27.9 in adult       Relevant Orders   Amb ref to Medical Nutrition Therapy-MNT   Prediabetes       Relevant Orders   Amb ref to Medical Nutrition Therapy-MNT   Dysfunction of Eustachian tube, unspecified laterality       Relevant Orders   Ambulatory referral to ENT      Discussed with patient most recent lab results which are essentially within normal limits or stable from prior with the exception of lipid panel. Bad cholesterol increased from 107 to 204, pt stopped statin medication and reports eating more seafood than usual before her lab visit. Patient will work on diet and lifestyle changes. Will also place referral to nutritionist. Recommend repeating lipid panel and A1c in 4-6 months. Followed by OB/GYN and has an appt 03/26/2022 for pap and mammogram. Pt  deferred flu vaccine. Pt reports continues to have issues with intermittent ear fullness and popping especially when flying, did not get contacted by previous ENT referral. Will place new referral.  Encourage to continue weight loss efforts with diet changes and exercise.   Return in about 1 year (around 01/22/2023) for CPE and FBW; lab visit in 4-6 months for repeat fasting lipid panel and A1c.       MLorrene Reid PA-C  [phone](phone) [phone](fax)  [place]

## 2022-03-03 ENCOUNTER — Encounter: Payer: 59 | Attending: Nurse Practitioner | Admitting: Dietician

## 2022-03-03 VITALS — Ht 66.0 in | Wt 173.2 lb

## 2022-03-03 DIAGNOSIS — E782 Mixed hyperlipidemia: Secondary | ICD-10-CM | POA: Insufficient documentation

## 2022-03-03 NOTE — Progress Notes (Signed)
Medical Nutrition Therapy  Appointment Start time:  1400  Appointment End time:  25  Primary concerns today: Overall healthy lifestyle   Referral diagnosis: mixed hyperlipidemia, E66.3, prediabetes Preferred learning style: no preference indicated Learning readiness: ready   NUTRITION ASSESSMENT   Anthropometrics  Ht: 66 Wt: 173.2 lb.   Clinical Medical Hx: HLD, prediabetes Medications: None reported.  Labs: 01/12/22: LDL 204,Trig 225, Chol 290, A1c 5.8% Notable Signs/Symptoms: None.  Food Allergies: None reported.   Lifestyle & Dietary Hx  Pt states she used to work out everyday but then had knee surgery and has been out of exercise since then. Pt states she uses an app to track her foods. Pt states she is does some intermittent fasting. Pt states he main exercises are swimming and biking.  Pt states she has weights and stationary bike at home.  Estimated daily fluid intake: ~48-64 oz Supplements: None reported.  Sleep: ~5-7 hours / feels rested  Stress / self-care: None reported. Current average weekly physical activity: ADL's  24-Hr Dietary Recall First Meal: coffee  Snack: None reported. Second Meal: 1/2 deli [place] sandwich and 1/2 greek salad Snack: Cheese Its or cheese and crackers  Third Meal: steak, shrimp, garlic peas, 6 oz wine, garlic toast and cheese Snack: None reported.  Beverages: coffee, red and white, water   NUTRITION DIAGNOSIS  [place]-2.1 Inpaired nutrition utilization As related to pre-diabetes.  As evidenced by A1c of 5.8%.   NUTRITION INTERVENTION  Nutrition education (E-1) on the following topics:  Unsaturated vs. Saturated fats Balanced meals  Foods that contain cholesterol  Functions of fiber The importance of adequate hydration  Benefits of physical activity  A1c and pre-diabetes Intermittent fasting risks and benefits  Handouts Provided Include  Dish up a healthy plate   Learning Style & Readiness for Change Teaching method  utilized: Visual & Auditory  Demonstrated degree of understanding via: Teach Back  Barriers to learning/adherence to lifestyle change: None identified.   Goals Established by Pt Eating red meat only 2 times a week. Start exercise 2-3 days a week for 30 minutes (stationary bike and home weights). Drinking 64 oz of water daily.    MONITORING & EVALUATION Dietary intake, weekly physical activity, and call if there are any questions.  Next Steps  Patient is to call for questions.

## 2022-03-26 LAB — HM MAMMOGRAPHY

## 2022-03-29 ENCOUNTER — Encounter: Payer: Self-pay | Admitting: Nurse Practitioner

## 2022-07-14 ENCOUNTER — Other Ambulatory Visit: Payer: Self-pay

## 2022-07-14 DIAGNOSIS — E782 Mixed hyperlipidemia: Secondary | ICD-10-CM

## 2022-07-14 DIAGNOSIS — R7303 Prediabetes: Secondary | ICD-10-CM

## 2022-07-14 DIAGNOSIS — Z130 Encounter for screening for diseases of the blood and blood-forming organs and certain disorders involving the immune mechanism: Secondary | ICD-10-CM

## 2022-07-14 DIAGNOSIS — Z0000 Encounter for general adult medical examination without abnormal findings: Secondary | ICD-10-CM

## 2022-07-22 ENCOUNTER — Other Ambulatory Visit: Payer: 59

## 2022-07-22 DIAGNOSIS — R7303 Prediabetes: Secondary | ICD-10-CM

## 2022-07-22 DIAGNOSIS — Z0000 Encounter for general adult medical examination without abnormal findings: Secondary | ICD-10-CM

## 2022-07-22 DIAGNOSIS — E782 Mixed hyperlipidemia: Secondary | ICD-10-CM

## 2022-07-22 DIAGNOSIS — Z130 Encounter for screening for diseases of the blood and blood-forming organs and certain disorders involving the immune mechanism: Secondary | ICD-10-CM

## 2022-07-23 LAB — LIPID PANEL
Chol/HDL Ratio: 6.4 ratio — ABNORMAL HIGH (ref 0.0–4.4)
Cholesterol, Total: 256 mg/dL — ABNORMAL HIGH (ref 100–199)
HDL: 40 mg/dL (ref >39)
LDL Chol Calc (NIH): 180 mg/dL — ABNORMAL HIGH (ref 0–99)
Triglycerides: 192 mg/dL — ABNORMAL HIGH (ref 0–149)
VLDL Cholesterol Cal: 36 mg/dL (ref 5–40)

## 2022-07-23 LAB — HEMOGLOBIN A1C
Est. average glucose Bld gHb Est-mCnc: 120 mg/dL
Hgb A1c MFr Bld: 5.8 % — ABNORMAL HIGH (ref 4.8–5.6)

## 2022-11-23 ENCOUNTER — Telehealth (INDEPENDENT_AMBULATORY_CARE_PROVIDER_SITE_OTHER): Payer: 59 | Admitting: Family Medicine

## 2022-11-23 ENCOUNTER — Encounter: Payer: Self-pay | Admitting: Family Medicine

## 2022-11-23 DIAGNOSIS — L237 Allergic contact dermatitis due to plants, except food: Secondary | ICD-10-CM | POA: Diagnosis not present

## 2022-11-23 MED ORDER — CLOBETASOL PROPIONATE 0.05 % EX CREA
1.0000 | TOPICAL_CREAM | Freq: Two times a day (BID) | CUTANEOUS | 1 refills | Status: DC
Start: 2022-11-23 — End: 2023-01-24

## 2022-11-23 MED ORDER — PREDNISONE 10 MG PO TABS
ORAL_TABLET | ORAL | 0 refills | Status: AC
Start: 2022-11-23 — End: 2022-12-06

## 2022-11-23 NOTE — Progress Notes (Signed)
Virtual Visit via Video Note  I connected with Candice Newton on 11/23/22 at  3:30 PM EDT by a video enabled telemedicine application and verified that I am speaking with the correct person using two identifiers.  Patient Location: Home Provider Location: Office/clinic  I discussed the limitations, risks, security, and privacy concerns of performing an evaluation and management service by video and the availability of in person appointments. I also discussed with the patient that there may be a patient responsible charge related to this service. The patient expressed understanding and agreed to proceed.    Subjective   Patient ID: Candice Newton, female    DOB: 1965-07-31  Age: 57 y.o. MRN: [id number]  Chief Complaint  Patient presents with   Rash   Poison Ivy    HPI Candice Newton is a 57 y.o. female presenting today for poison ivy rash. Rash: Patient complains of rash involving the left arm. Rash started 1 day ago. Appearance of rash at onset:  Single raised itchy vesicle on left forearm. Rash has changed over time and has now spread up her arm nearly reaching her shoulder. Discomfort associated with rash: is painful and is pruritic.  Associated symptoms: none. Denies: abdominal pain, congestion, cough, decrease in appetite, fever, headache, myalgia, nausea, sore throat, and vomiting. Patient has had previous evaluation of similar rash due to poison ivy, she states that she tends to have intense reactions to poison ivy.  The last time that she came in contact with that she went to urgent care and had to start an oral prednisone taper because topical steroids did not keep the rash from spreading. Patient has not had contacts with similar rash. Patient has identified precipitant as poison ivy or poison oak both of which are in her yard.  ROS Negative unless otherwise noted in HPI  No outpatient medications prior to visit.   No facility-administered medications prior to visit.      Objective:     Physical Exam General: Speaking clearly in complete sentences without any shortness of breath.  Alert and oriented x3.  Normal judgment. No apparent acute distress. Skin: Erythematous rash across left forearm and upper arm with ~1-2 millimeter white vesicles scattered throughout.   Assessment & Plan:  Poison ivy dermatitis -     predniSONE; Take 4 tablets (40 mg total) by mouth daily with breakfast for 4 days, THEN 3 tablets (30 mg total) daily with breakfast for 4 days, THEN 2 tablets (20 mg total) daily with breakfast for 3 days, THEN 1 tablet (10 mg total) daily with breakfast for 3 days.  Dispense: 37 tablet; Refill: 0 -     Clobetasol Propionate; Apply 1 Application topically 2 (two) times daily. For poison ivy/oak.  Dispense: 30 g; Refill: 1  Poison ivy -     predniSONE; Take 4 tablets (40 mg total) by mouth daily with breakfast for 4 days, THEN 3 tablets (30 mg total) daily with breakfast for 4 days, THEN 2 tablets (20 mg total) daily with breakfast for 3 days, THEN 1 tablet (10 mg total) daily with breakfast for 3 days.  Dispense: 37 tablet; Refill: 0 -     Clobetasol Propionate; Apply 1 Application topically 2 (two) times daily. For poison ivy/oak.  Dispense: 30 g; Refill: 1  Start 2-week taper of oral steroids as prescribed by urgent care that is actively eradicated poison ivy rash 2 years ago.  I will also send a prescription of topical steroid cream for  her to use immediately at future contact with poison ivy to hopefully prevent the need for oral steroids.  Return in about 2 months (around 01/23/2023) for annual physical, fasting blood work 1 week before.   I discussed the assessment and treatment plan with the patient. The patient was provided an opportunity to ask questions, and all were answered. The patient agreed with the plan and demonstrated an understanding of the instructions.   The patient was advised to call back or seek an in-person evaluation if the  symptoms worsen or if the condition fails to improve as anticipated.  The above assessment and management plan was discussed with the patient. The patient verbalized understanding of and has agreed to the management plan.   Melida Quitter, PA

## 2023-01-24 ENCOUNTER — Ambulatory Visit (INDEPENDENT_AMBULATORY_CARE_PROVIDER_SITE_OTHER): Payer: 59 | Admitting: Family Medicine

## 2023-01-24 ENCOUNTER — Encounter: Payer: Self-pay | Admitting: Family Medicine

## 2023-01-24 VITALS — BP 145/73 | HR 84 | Resp 18 | Ht 66.0 in | Wt 176.0 lb

## 2023-01-24 DIAGNOSIS — E782 Mixed hyperlipidemia: Secondary | ICD-10-CM | POA: Diagnosis not present

## 2023-01-24 DIAGNOSIS — Z1159 Encounter for screening for other viral diseases: Secondary | ICD-10-CM

## 2023-01-24 DIAGNOSIS — E559 Vitamin D deficiency, unspecified: Secondary | ICD-10-CM | POA: Diagnosis not present

## 2023-01-24 DIAGNOSIS — Z0000 Encounter for general adult medical examination without abnormal findings: Secondary | ICD-10-CM

## 2023-01-24 DIAGNOSIS — H6992 Unspecified Eustachian tube disorder, left ear: Secondary | ICD-10-CM

## 2023-01-24 DIAGNOSIS — Z131 Encounter for screening for diabetes mellitus: Secondary | ICD-10-CM

## 2023-01-24 DIAGNOSIS — Z1329 Encounter for screening for other suspected endocrine disorder: Secondary | ICD-10-CM

## 2023-01-24 NOTE — Progress Notes (Signed)
Complete physical exam  Patient: Candice Newton   DOB: 1965/07/19   56 y.o. Female  MRN: [id number]  Subjective:    Chief Complaint  Patient presents with   Annual Exam    Candice Newton is a 57 y.o. female who presents today for a complete physical exam. She reports consuming a general diet.  She enjoys riding her e-bike, going to the pool to swim in the summer, and she recently started doing yoga and Pilates.  She generally feels well. She reports sleeping well. She would like a referral to ENT to address chronic eustachian tube dysfunction that is aggravated by airplane travel.    Most recent fall risk assessment:    01/24/2023    8:53 AM  Fall Risk   Falls in the past year? 0  Number falls in past yr: 0  Injury with Fall? 0  Risk for fall due to : No Fall Risks  Follow up Falls evaluation completed     Most recent depression and anxiety screenings:    01/24/2023    8:53 AM 01/21/2022    9:31 AM  PHQ 2/9 Scores  PHQ - 2 Score 0 0  PHQ- 9 Score 0 0      01/24/2023    8:53 AM 01/21/2022    9:31 AM 01/05/2021    3:21 PM 12/30/2020    1:08 PM  GAD 7 : Generalized Anxiety Score  Nervous, Anxious, on Edge 0 0 0 0  Control/stop worrying 0 0 0 0  Worry too much - different things 0 0 0 0  Trouble relaxing 0 0 0 0  Restless 0 0 0 0  Easily annoyed or irritable 0 0 0 0  Afraid - awful might happen 0 0 0 0  Total GAD 7 Score 0 0 0 0  Anxiety Difficulty Not difficult at all Not difficult at all Not difficult at all     Patient Active Problem List   Diagnosis Date Noted   Chronic allergic rhinitis 12/30/2020   Elevated blood pressure, situational 09/13/2018   Mixed hyperlipidemia- HA's from meds 09/06/2016   h/o Vitamin D deficiency 09/06/2016   Overweight (BMI 25.0-29.9) 09/06/2016   Seasonal allergies 09/06/2016    Past Surgical History:  Procedure Laterality Date   APPENDECTOMY     BREAST LUMPECTOMY WITH RADIOACTIVE SEED LOCALIZATION Right 04/07/2015   Procedure:  RIGHT BREAST LUMPECTOMY WITH RADIOACTIVE SEED LOCALIZATION;  Surgeon: Claud Kelp, MD;  Location: [facility];  Service: General;  Laterality: Right;   BUNIONECTOMY Left    TONSILLECTOMY     Social History   Tobacco Use   Smoking status: Never    Passive exposure: Never   Smokeless tobacco: Never  Vaping Use   Vaping status: Never Used  Substance Use Topics   Alcohol use: Yes    Comment: social   Drug use: No   Family History  Problem Relation Age of Onset   Cancer Mother        lung   Cancer Father        lung   Diabetes Brother    Hyperlipidemia Brother    Thyroid disease Brother    Esophageal cancer Brother        10 dx esophageal ca- mets from Lung ca   Heart disease Maternal Grandmother    Colon cancer Neg Hx    Rectal cancer Neg Hx    Stomach cancer Neg Hx    Allergies  Allergen Reactions  Penicillins Hives and Shortness Of Breath    hives     Patient Care Team: Melida Quitter, PA as PCP - General (Family Medicine) Larey Dresser, DPM as Consulting Physician (Podiatry) Genia Del, MD as Consulting Physician (Obstetrics and Gynecology)   Outpatient Medications Prior to Visit  Medication Sig   [DISCONTINUED] clobetasol cream (TEMOVATE) 0.05 % Apply 1 Application topically 2 (two) times daily. For poison ivy/oak.   No facility-administered medications prior to visit.    Review of Systems  Constitutional:  Negative for chills, fever and malaise/fatigue.  HENT:  Negative for congestion and hearing loss.   Eyes:  Negative for blurred vision and double vision.  Respiratory:  Negative for cough and shortness of breath.   Cardiovascular:  Negative for chest pain, palpitations and leg swelling.  Gastrointestinal:  Negative for abdominal pain, constipation, diarrhea and heartburn.  Genitourinary:  Negative for frequency and urgency.  Musculoskeletal:  Negative for myalgias and neck pain.  Neurological:  Negative for headaches.   Endo/Heme/Allergies:  Negative for polydipsia.  Psychiatric/Behavioral:  Negative for depression. The patient is not nervous/anxious and does not have insomnia.       Objective:    BP (!) 145/73 (BP Location: Left Arm, Patient Position: Sitting, Cuff Size: Normal)   Pulse 84   Resp 18   Ht 5\' 6"  (1.676 m)   Wt 176 lb (79.8 kg)   SpO2 98%   BMI 28.41 kg/m    Physical Exam Constitutional:      General: She is not in acute distress.    Appearance: Normal appearance.  HENT:     Head: Normocephalic and atraumatic.     Right Ear: Tympanic membrane, ear canal and external ear normal. There is no impacted cerumen.     Left Ear: Tympanic membrane, ear canal and external ear normal. There is no impacted cerumen.     Nose: Nose normal.     Mouth/Throat:     Mouth: Mucous membranes are moist.     Pharynx: No oropharyngeal exudate or posterior oropharyngeal erythema.  Eyes:     Extraocular Movements: Extraocular movements intact.     Conjunctiva/sclera: Conjunctivae normal.     Pupils: Pupils are equal, round, and reactive to light.     Comments: Wearing glasses  Neck:     Thyroid: No thyroid mass, thyromegaly or thyroid tenderness.  Cardiovascular:     Rate and Rhythm: Normal rate and regular rhythm.     Heart sounds: Normal heart sounds. No murmur heard.    No friction rub. No gallop.  Pulmonary:     Effort: Pulmonary effort is normal. No respiratory distress.     Breath sounds: Normal breath sounds. No wheezing, rhonchi or rales.  Abdominal:     General: Abdomen is flat. Bowel sounds are normal. There is no distension.     Palpations: There is no mass.     Tenderness: There is no abdominal tenderness. There is no guarding.  Musculoskeletal:        General: Normal range of motion.     Cervical back: Normal range of motion and neck supple.  Lymphadenopathy:     Cervical: No cervical adenopathy.  Skin:    General: Skin is warm and dry.  Neurological:     Mental Status: She  is alert and oriented to person, place, and time.     Cranial Nerves: No cranial nerve deficit.     Motor: No weakness.     Deep Tendon Reflexes: Reflexes  normal.  Psychiatric:        Mood and Affect: Mood normal.       Assessment & Plan:    Routine Health Maintenance and Physical Exam  Immunization History  Administered Date(s) Administered   Tdap 08/06/2008, 09/05/2018   Zoster Recombinant(Shingrix) 12/31/2019, 03/03/2020    Health Maintenance  Topic Date Due   Hepatitis C Screening  Never done   COVID-19 Vaccine (1 - 2023-24 season) Never done   INFLUENZA VACCINE  06/13/2023 (Originally 10/14/2022)   HIV Screening  09/06/2028 (Originally 06/25/1981)   Cervical Cancer Screening (HPV/Pap Cotest)  09/11/2023   MAMMOGRAM  03/26/2024   Colonoscopy  12/16/2027   DTaP/Tdap/Td (3 - Td or Tdap) 09/04/2028   Zoster Vaccines- Shingrix  Completed   HPV VACCINES  Aged Out    Patient is fasting, collecting labs including CBC, CMP, lipid panel, A1C, TSH, and vitamin D.  Completing hepatitis C and HIV screenings. Cervical cancer screening up-to-date, completed by [facility]. Declines influenza vaccine.  Discussed health benefits of physical activity, and encouraged her to engage in regular exercise appropriate for her age and condition.  Wellness examination  h/o Vitamin D deficiency -     VITAMIN D 25 Hydroxy (Vit-D Deficiency, Fractures); Future  Mixed hyperlipidemia- HA's from meds -     CBC with Differential/Platelet; Future -     Comprehensive metabolic panel; Future -     Lipid panel; Future  Screening for diabetes mellitus -     Hemoglobin A1c; Future  Screening for thyroid disorder -     TSH Rfx on Abnormal to Free T4; Future  Screening for viral disease -     Hepatitis C antibody; Future -     HIV Antibody (routine testing w rflx); Future  Eustachian tube dysfunction, left -     Ambulatory referral to ENT  ENT referral placed to Dr. Suszanne Conners as  requested.  Return in about 1 year (around 01/24/2024) for annual physical, fasting blood work 1 week before.     Melida Quitter, PA

## 2023-01-24 NOTE — Patient Instructions (Addendum)
Keep up the great work!  Send me a MyChart message when you check your blood pressure at home later today :)  Things to do to keep yourself healthy! - Exercise at least 30-45 minutes a day, 3-4 days a week.  - Eat a low-fat diet with lots of fruits and vegetables, up to 7-9 servings per day.  - Seatbelts can save your life. Wear them always.  - Smoke detectors on every level of your home, check batteries every year.  - Eye Doctor: have an eye exam every 1-2 years, even if you do not wear glasses or contacts. - Safe sex: if you may be exposed to STDs, use a condom.  - Alcohol: If you drink, do it moderately, less than 2 drinks per day.  - Health Care Power of Attorney: choose someone to speak for you if you are not able.  - Depression and anxiety are common in our stressful world. If you're feeling stressed, down, or like you're losing interest in things you normally enjoy, please come in for a visit.  - Violence: If anyone is threatening or hurting you, please call immediately.  Everyone deserves to be safe and loved in all relationships.

## 2023-01-25 LAB — HEPATITIS C ANTIBODY: Hep C Virus Ab: NONREACTIVE

## 2023-01-25 LAB — CBC WITH DIFFERENTIAL/PLATELET
Basophils Absolute: 0 10*3/uL (ref 0.0–0.2)
Basos: 1 %
EOS (ABSOLUTE): 0.1 10*3/uL (ref 0.0–0.4)
Eos: 3 %
Hematocrit: 44.4 % (ref 34.0–46.6)
Hemoglobin: 14.6 g/dL (ref 11.1–15.9)
Immature Grans (Abs): 0 10*3/uL (ref 0.0–0.1)
Immature Granulocytes: 0 %
Lymphocytes Absolute: 1.5 10*3/uL (ref 0.7–3.1)
Lymphs: 35 %
MCH: 28.5 pg (ref 26.6–33.0)
MCHC: 32.9 g/dL (ref 31.5–35.7)
MCV: 87 fL (ref 79–97)
Monocytes Absolute: 0.5 10*3/uL (ref 0.1–0.9)
Monocytes: 11 %
Neutrophils Absolute: 2 10*3/uL (ref 1.4–7.0)
Neutrophils: 50 %
Platelets: 187 10*3/uL (ref 150–450)
RBC: 5.12 x10E6/uL (ref 3.77–5.28)
RDW: 13.4 % (ref 11.7–15.4)
WBC: 4.1 10*3/uL (ref 3.4–10.8)

## 2023-01-25 LAB — LIPID PANEL
Chol/HDL Ratio: 6.4 ratio — ABNORMAL HIGH (ref 0.0–4.4)
Cholesterol, Total: 283 mg/dL — ABNORMAL HIGH (ref 100–199)
HDL: 44 mg/dL (ref >39)
LDL Chol Calc (NIH): 205 mg/dL — ABNORMAL HIGH (ref 0–99)
Triglycerides: 176 mg/dL — ABNORMAL HIGH (ref 0–149)
VLDL Cholesterol Cal: 34 mg/dL (ref 5–40)

## 2023-01-25 LAB — HEMOGLOBIN A1C
Est. average glucose Bld gHb Est-mCnc: 126 mg/dL
Hgb A1c MFr Bld: 6 % — ABNORMAL HIGH (ref 4.8–5.6)

## 2023-01-25 LAB — COMPREHENSIVE METABOLIC PANEL
ALT: 35 [IU]/L — ABNORMAL HIGH (ref 0–32)
AST: 19 [IU]/L (ref 0–40)
Albumin: 4.5 g/dL (ref 3.8–4.9)
Alkaline Phosphatase: 84 [IU]/L (ref 44–121)
BUN/Creatinine Ratio: 21 (ref 9–23)
BUN: 15 mg/dL (ref 6–24)
Bilirubin Total: 0.4 mg/dL (ref 0.0–1.2)
CO2: 24 mmol/L (ref 20–29)
Calcium: 9.8 mg/dL (ref 8.7–10.2)
Chloride: 102 mmol/L (ref 96–106)
Creatinine, Ser: 0.71 mg/dL (ref 0.57–1.00)
Globulin, Total: 2.3 g/dL (ref 1.5–4.5)
Glucose: 120 mg/dL — ABNORMAL HIGH (ref 70–99)
Potassium: 4.8 mmol/L (ref 3.5–5.2)
Sodium: 141 mmol/L (ref 134–144)
Total Protein: 6.8 g/dL (ref 6.0–8.5)
eGFR: 100 mL/min/{1.73_m2} (ref >59)

## 2023-01-25 LAB — VITAMIN D 25 HYDROXY (VIT D DEFICIENCY, FRACTURES): Vit D, 25-Hydroxy: 36.4 ng/mL (ref 30.0–100.0)

## 2023-01-25 LAB — TSH RFX ON ABNORMAL TO FREE T4: TSH: 1.81 u[IU]/mL (ref 0.450–4.500)

## 2023-01-25 LAB — HIV ANTIBODY (ROUTINE TESTING W REFLEX): HIV Screen 4th Generation wRfx: NONREACTIVE

## 2023-02-17 ENCOUNTER — Encounter (INDEPENDENT_AMBULATORY_CARE_PROVIDER_SITE_OTHER): Payer: Self-pay

## 2023-02-17 ENCOUNTER — Ambulatory Visit (INDEPENDENT_AMBULATORY_CARE_PROVIDER_SITE_OTHER): Payer: 59 | Admitting: Audiology

## 2023-02-17 ENCOUNTER — Ambulatory Visit (INDEPENDENT_AMBULATORY_CARE_PROVIDER_SITE_OTHER): Payer: 59 | Admitting: Physician Assistant

## 2023-02-17 DIAGNOSIS — H6993 Unspecified Eustachian tube disorder, bilateral: Secondary | ICD-10-CM | POA: Diagnosis not present

## 2023-02-17 DIAGNOSIS — Z0110 Encounter for examination of ears and hearing without abnormal findings: Secondary | ICD-10-CM | POA: Diagnosis not present

## 2023-02-17 NOTE — Progress Notes (Signed)
  [address] [place], [place] [address] [id number]  Audiological Evaluation    Name: Candice Newton     DOB:   [birth year]      MRN:   [id number]                                                                                     Service Date: 02/17/2023        Patient was referred today for a hearing evaluation by Eyvonne Mechanic, PA-C.  Symptoms Yes Details  Hearing loss  [x]   Patient reported perceiving some hearing loss.  Tinnitus  [x]   Patient reported experiencing intermittent tinnitus.  Balance problems  []   Patient denied vertigo/imbalance sensations.  Previous ear surgeries  []   Patient denied any previous ear surgeries.  Family history  [x]   Patient reported a family history of hearing loss.  Amplification  []   Patient denied the use of hearing aids.    Tympanogram: Right ear: Normal external ear canal volume with normal middle ear pressure and tympanic membrane compliance (Type A). Left ear: Normal external ear canal volume with normal middle ear pressure and tympanic membrane compliance (Type A).    Hearing Evaluation: The audiogram was completed using conventional audiometric techniques under headphones with good reliability.   The hearing test results indicate: Right ear: Normal hearing sensitivity from [phone] Hz. Left ear: Normal hearing sensitivity from [phone] Hz.  Speech Audiometry: Right ear- Speech Reception Threshold (SRT) was obtained at 5 dBHL. Left ear- Speech Reception Threshold (SRT) was obtained at 5 dBHL.   Word Recognition Score Tested using NU-6 (MLV) Right ear: 100% was obtained at a presentation level of 45 dBHL which is deemed as excellent understanding. Left ear: 100% was obtained at a presentation level of 45 dBHL which is deemed as excellent understanding.    Impression:  There is not a significant difference between puretone thresholds and word recognition scores between ears.   Recommendations: Repeat audiogram  when changes are perceived or per provider.   Conley Rolls Glendell Fouse, AUD, CCC-A 02/17/23

## 2023-02-17 NOTE — Progress Notes (Signed)
Dear Dr. Jairo Ben, Here is my assessment for our mutual patient, Candice Newton. Thank you for allowing me the opportunity to care for your patient. Please do not hesitate to contact me should you have any other questions. Sincerely, Burna Forts PA-C  Otolaryngology Clinic Note Referring provider: Dr. Jairo Ben HPI:  Candice Newton is a 57 y.o. female kindly referred by Dr. Jairo Ben for evaluation of ear pressure  The patient notes a longstanding history of ear complaints.  She notes that both ears are involved but left seems to be more symptomatic than the right.  She notes as if she always has some fullness in the ears, she feels like they are popping, and feels like she is underwater when she is flying.  She notes some soreness below the left ear and along the left sinuses as well.  She denies any hearing loss but does report her family feels like her hearing may not be normal.  She notes that when she was a child she had repeat ear infections, she did not have tubes placed.  She did have a tonsillectomy as a kid.  She notes that she gets 1-2 ear infections a year as an adult that required antibiotics and no further management.  She notes that she has some seasonal allergies and takes Zyrtec only as needed.  She has used Flonase previously but not consistently.  She denies any associated dizziness or vertigo, no tinnitus.  She denies a history of acid reflux other than when eating spicy foods.  No head or neck trauma, no head or neck surgery.  No history of neck cancer or radiation.   H&N Surgery: tonsillectomy      Independent Review of Additional Tests or Records:  02/17/2023 Audiogram was independently reviewed and interpreted by me and it reveals Right ear: Normal hearing from 250 to 8000 Hz;. 100 % word interpretation at 45 dB; type a tympanogram Left ear: Hearing from 250 through 8000 Hz; 100% word interpretation at 45 dB; type a tympanogram  Normal audiogram    PMH/Meds/All/SocHx/FamHx/ROS:    Past Medical History:  Diagnosis Date   Breast mass    right   h/o Gestational diabetes-  with all 3 children 09/06/2016   Patient had history of gestational diabetes with her first child when she was younger.  It was so bad she was almost close to having to be on insulin.  The episodes became less severe with each pregnancy.  Never needed meds--> pt only ate chicken and green beans     History of bunionectomy of left great toe 09/06/2016   History of lumpectomy of right breast - benign 04/07/2015   Hyperlipidemia      Past Surgical History:  Procedure Laterality Date   APPENDECTOMY     BREAST LUMPECTOMY WITH RADIOACTIVE SEED LOCALIZATION Right 04/07/2015   Procedure: RIGHT BREAST LUMPECTOMY WITH RADIOACTIVE SEED LOCALIZATION;  Surgeon: Claud Kelp, MD;  Location: [facility];  Service: General;  Laterality: Right;   BUNIONECTOMY Left    TONSILLECTOMY      Family History  Problem Relation Age of Onset   Cancer Mother        lung   Cancer Father        lung   Diabetes Brother    Hyperlipidemia Brother    Thyroid disease Brother    Esophageal cancer Brother        61 dx esophageal ca- mets from Lung ca   Heart disease Maternal Grandmother  Colon cancer Neg Hx    Rectal cancer Neg Hx    Stomach cancer Neg Hx      Social Connections: Socially Integrated (01/24/2023)   Social Connection and Isolation Panel [NHANES]    Frequency of Communication with Friends and Family: More than three times a week    Frequency of Social Gatherings with Friends and Family: More than three times a week    Attends Religious Services: More than 4 times per year    Active Member of [employer] or Organizations: Yes    Attends Engineer, structural: More than 4 times per year    Marital Status: Married     No current outpatient medications on file.   Physical Exam:   There were no vitals taken for this visit.  Pertinent Findings  CN II-XII intact  Bilateral EAC clear  and TM intact with well pneumatized middle ear spaces; small abrasion along the inferior portion of the right EAC Weber 512: Equal Rinne 512: AC > BC b/l  Anterior rhinoscopy: Septum midline; bilateral inferior turbinates with no hypertrophy No lesions of oral cavity/oropharynx; dentition within normal limits, minimal redundant tonsillar tissue No obviously palpable neck masses/lymphadenopathy/thyromegaly No respiratory distress or stridor  Seprately Identifiable Procedures:  None  Impression & Plans:  Candice Newton is a 57 y.o. female with ear symptoms.  Eustachian tube dysfunction-  The patient's description of symptoms is most consistent with eustachian tube dysfunction.  She describes ear fullness bilateral worse on the left, difficulty with equalizing pressure, and does have 1-2 ear infections a year as an adult.  Her exam is benign, her audiogram shows normal temps as well as normal hearing.  She does describe some allergic type symptoms although this is not persistent, as well as occasional acid reflux again not persistent.  I discussed with the patient trying a consistent trial of Flonase, she will try this for 3 months.  I also discussed the use of reflux Gourmet for potential acid reflux.  Although her description of symptoms are consistent with eustachian tube dysfunction I do not have any objective evidence at this time.  I did discuss with her that we may perform a nasal endoscopy in the future to try and assess the eustachian tube.  She would like to think about this and we can discuss it at a follow-up visit in 3 months.  The patient is happy with today's plan, she was given strict return precautions, she had no further questions or concerns.   - f/u 3 months      Thank you for allowing me the opportunity to care for your patient. Please do not hesitate to contact me should you have any other questions.  Sincerely, Burna Forts PA-C [facility] Phone:  [phone] Fax: [phone]  02/17/2023, 8:38 AM

## 2023-04-21 ENCOUNTER — Encounter: Payer: Self-pay | Admitting: Family Medicine

## 2023-05-02 ENCOUNTER — Encounter: Payer: Self-pay | Admitting: Obstetrics and Gynecology

## 2023-05-02 LAB — HM MAMMOGRAPHY

## 2023-05-19 ENCOUNTER — Ambulatory Visit (INDEPENDENT_AMBULATORY_CARE_PROVIDER_SITE_OTHER): Payer: 59

## 2023-06-09 ENCOUNTER — Ambulatory Visit (INDEPENDENT_AMBULATORY_CARE_PROVIDER_SITE_OTHER): Payer: 59

## 2023-06-15 ENCOUNTER — Telehealth (INDEPENDENT_AMBULATORY_CARE_PROVIDER_SITE_OTHER): Payer: Self-pay | Admitting: Physician Assistant

## 2023-06-15 NOTE — Telephone Encounter (Signed)
 LVM to confirm appt & location [id number] afm

## 2023-06-16 ENCOUNTER — Encounter (INDEPENDENT_AMBULATORY_CARE_PROVIDER_SITE_OTHER): Payer: Self-pay | Admitting: Physician Assistant

## 2023-06-16 ENCOUNTER — Ambulatory Visit (INDEPENDENT_AMBULATORY_CARE_PROVIDER_SITE_OTHER): Admitting: Physician Assistant

## 2023-06-16 DIAGNOSIS — H6993 Unspecified Eustachian tube disorder, bilateral: Secondary | ICD-10-CM

## 2023-06-16 DIAGNOSIS — H6990 Unspecified Eustachian tube disorder, unspecified ear: Secondary | ICD-10-CM | POA: Diagnosis not present

## 2023-06-16 MED ORDER — FAMOTIDINE 10 MG PO TABS: 10.0000 mg | ORAL_TABLET | Freq: Two times a day (BID) | ORAL | 11 refills | Status: AC

## 2023-06-16 NOTE — Progress Notes (Signed)
 Dear Dr. Jairo Ben, Here is my assessment for our mutual patient, Moncerrath Berhe. Thank you for allowing me the opportunity to care for your patient. Please do not hesitate to contact me should you have any other questions. Sincerely, Burna Forts PA-C  Otolaryngology Clinic Note Referring provider: Dr. Jairo Ben HPI:  Myeshia Fojtik is a 58 y.o. female kindly referred by Dr. Jairo Ben   The patient is a 58 year old female seen in our office for follow-up evaluation of eustachian tube dysfunction the patient was last seen in the office on 02/17/2023.  Below is a recap of that visit.   The patient notes a longstanding history of ear complaints. She notes that both ears are involved but left seems to be more symptomatic than the right. She notes as if she always has some fullness in the ears, she feels like they are popping, and feels like she is underwater when she is flying. She notes some soreness below the left ear and along the left sinuses as well. She denies any hearing loss but does report her family feels like her hearing may not be normal. She notes that when she was a child she had repeat ear infections, she did not have tubes placed. She did have a tonsillectomy as a kid. She notes that she gets 1-2 ear infections a year as an adult that required antibiotics and no further management. She notes that she has some seasonal allergies and takes Zyrtec only as needed. She has used Flonase previously but not consistently. She denies any associated dizziness or vertigo, no tinnitus. She denies a history of acid reflux other than when eating spicy foods. No head or neck trauma, no head or neck surgery. No history of neck cancer or radiation.  Since her last office visit she notes persistent symptoms.  She has been using the reflux Gourmet as well as the Flonase consistently.  She notes that the reflux Gourmet it has helped significantly.  She notes persistent symptoms most notably on the left.  She feels  fullness.   H&N Surgery: Tonsillectomy    Independent Review of Additional Tests or Records:  none   PMH/Meds/All/SocHx/FamHx/ROS:   Past Medical History:  Diagnosis Date   Breast mass    right   h/o Gestational diabetes-  with all 3 children 09/06/2016   Patient had history of gestational diabetes with her first child when she was younger.  It was so bad she was almost close to having to be on insulin.  The episodes became less severe with each pregnancy.  Never needed meds--> pt only ate chicken and green beans     History of bunionectomy of left great toe 09/06/2016   History of lumpectomy of right breast - benign 04/07/2015   Hyperlipidemia      Past Surgical History:  Procedure Laterality Date   APPENDECTOMY     BREAST LUMPECTOMY WITH RADIOACTIVE SEED LOCALIZATION Right 04/07/2015   Procedure: RIGHT BREAST LUMPECTOMY WITH RADIOACTIVE SEED LOCALIZATION;  Surgeon: Claud Kelp, MD;  Location: [facility];  Service: General;  Laterality: Right;   BUNIONECTOMY Left    TONSILLECTOMY      Family History  Problem Relation Age of Onset   Cancer Mother        lung   Cancer Father        lung   Diabetes Brother    Hyperlipidemia Brother    Thyroid disease Brother    Esophageal cancer Brother        17  dx esophageal ca- mets from Lung ca   Heart disease Maternal Grandmother    Colon cancer Neg Hx    Rectal cancer Neg Hx    Stomach cancer Neg Hx      Social Connections: Socially Integrated (01/24/2023)   Social Connection and Isolation Panel [NHANES]    Frequency of Communication with Friends and Family: More than three times a week    Frequency of Social Gatherings with Friends and Family: More than three times a week    Attends Religious Services: More than 4 times per year    Active Member of [employer] or Organizations: Yes    Attends Engineer, structural: More than 4 times per year    Marital Status: Married     No current outpatient  medications on file.   Physical Exam:   There were no vitals taken for this visit.  Pertinent Findings  CN II-XII intact  Bilateral EAC clear and TM intact with well pneumatized middle ear spaces Weber 512: equal Rinne 512: AC > BC b/l  Anterior rhinoscopy: Septum midline; bilateral inferior turbinates with no hypertrophy No lesions of oral cavity/oropharynx; dentition wnl No obviously palpable neck masses/lymphadenopathy/thyromegaly No respiratory distress or stridor  Seprately Identifiable Procedures:  None  Impression & Plans:  Jacqulyne Gladue is a 58 y.o. female with the following   Eustachian tube dysfunction-  The patient presents today with continued eustachian tube dysfunction symptoms.  I did review her audiogram again from her last visit, no fluid or abnormal tympanometry.  She has been doing the reflux Gourmet as well as the Flonase.  I discussed treatment options including continued watch and wait approach.  We have decided that we will add Zantac to her treatment, and I like to see her back in the office in 3 months with repeat audiogram.  If she has persistent symptoms at that time we can discuss tympanostomy tubes although I have lower suspicion that these will significantly prove her symptoms as we have no objective information now.  She understands that and would like to continue a conservative approach until that time.   - f/u 3 month with repeat audiogram    Thank you for allowing me the opportunity to care for your patient. Please do not hesitate to contact me should you have any other questions.  Sincerely, Burna Forts PA-C [facility] Phone: [phone] Fax: [phone]  06/16/2023, 8:39 AM

## 2023-10-24 ENCOUNTER — Encounter (INDEPENDENT_AMBULATORY_CARE_PROVIDER_SITE_OTHER): Payer: Self-pay | Admitting: Physician Assistant

## 2023-10-24 ENCOUNTER — Ambulatory Visit (INDEPENDENT_AMBULATORY_CARE_PROVIDER_SITE_OTHER): Admitting: Physician Assistant

## 2023-10-24 VITALS — BP 144/94 | HR 73

## 2023-10-24 DIAGNOSIS — J320 Chronic maxillary sinusitis: Secondary | ICD-10-CM

## 2023-10-24 DIAGNOSIS — H9393 Unspecified disorder of ear, bilateral: Secondary | ICD-10-CM | POA: Diagnosis not present

## 2023-10-24 DIAGNOSIS — H6992 Unspecified Eustachian tube disorder, left ear: Secondary | ICD-10-CM

## 2023-10-24 MED ORDER — FLUCONAZOLE 150 MG PO TABS: 150.0000 mg | ORAL_TABLET | Freq: Every day | ORAL | 0 refills | Status: AC

## 2023-10-24 MED ORDER — DOXYCYCLINE HYCLATE 100 MG PO TABS: 100.0000 mg | ORAL_TABLET | Freq: Two times a day (BID) | ORAL | 0 refills | Status: AC

## 2023-10-24 NOTE — Progress Notes (Signed)
 Dear Dr. Wallace, Here is my assessment for our mutual patient, Candice Newton. Thank you for allowing me the opportunity to care for your patient. Please do not hesitate to contact me should you have any other questions. Sincerely, Chyrl Cohen PA-C  Otolaryngology Clinic Note Referring provider: Dr. Wallace HPI:  Candice Newton is a 58 y.o. female kindly referred by Dr. Wallace   The patient is a 58 year old female seen in our office for evaluation of left-sided ear fullness.  The patient was last seen in the office on 06/16/2023.  Below is recap of that encounter.   The patient notes a longstanding history of ear complaints. She notes that both ears are involved but left seems to be more symptomatic than the right. She notes as if she always has some fullness in the ears, she feels like they are popping, and feels like she is underwater when she is flying. She notes some soreness below the left ear and along the left sinuses as well. She denies any hearing loss but does report her family feels like her hearing may not be normal. She notes that when she was a child she had repeat ear infections, she did not have tubes placed. She did have a tonsillectomy as a kid. She notes that she gets 1-2 ear infections a year as an adult that required antibiotics and no further management. She notes that she has some seasonal allergies and takes Zyrtec only as needed. She has used Flonase previously but not consistently. She denies any associated dizziness or vertigo, no tinnitus. She denies a history of acid reflux other than when eating spicy foods. No head or neck trauma, no head or neck surgery. No history of neck cancer or radiation.   Since her last office visit she notes persistent symptoms.  She has been using the reflux Gourmet as well as the Flonase consistently.  She notes that the reflux Gourmet it has helped significantly.  She notes persistent symptoms most notably on the left.  She feels fullness.     H&N  Surgery: Tonsillectomy    Update 10/24/2023.  Since her last office visit she notes the symptoms have persisted, she still has muffling, worse with airline travel.  She additionally notes some left-sided sinus pressure and discomfort, this is worse in the morning, she does note this radiates to the ear.  She has consistently been using the Flonase and reflux Gourmet.  She denies any change in the symptoms, no improvement although no significant worsening.   Independent Review of Additional Tests or Records:  none   PMH/Meds/All/SocHx/FamHx/ROS:   Past Medical History:  Diagnosis Date   Breast mass    right   h/o Gestational diabetes-  with all 3 children 09/06/2016   Patient had history of gestational diabetes with her first child when she was younger.  It was so bad she was almost close to having to be on insulin.  The episodes became less severe with each pregnancy.  Never needed meds--> pt only ate chicken and green beans     History of bunionectomy of left great toe 09/06/2016   History of lumpectomy of right breast - benign 04/07/2015   Hyperlipidemia      Past Surgical History:  Procedure Laterality Date   APPENDECTOMY     BREAST LUMPECTOMY WITH RADIOACTIVE SEED LOCALIZATION Right 04/07/2015   Procedure: RIGHT BREAST LUMPECTOMY WITH RADIOACTIVE SEED LOCALIZATION;  Surgeon: Elon Pacini, MD;  Location: [facility];  Service: General;  Laterality:  Right;   BUNIONECTOMY Left    TONSILLECTOMY      Family History  Problem Relation Age of Onset   Cancer Mother        lung   Cancer Father        lung   Diabetes Brother    Hyperlipidemia Brother    Thyroid disease Brother    Esophageal cancer Brother        59 dx esophageal ca- mets from Lung ca   Heart disease Maternal Grandmother    Colon cancer Neg Hx    Rectal cancer Neg Hx    Stomach cancer Neg Hx      Social Connections: Socially Integrated (01/24/2023)   Social Connection and Isolation Panel     Frequency of Communication with Friends and Family: More than three times a week    Frequency of Social Gatherings with Friends and Family: More than three times a week    Attends Religious Services: More than 4 times per year    Active Member of [employer] or Organizations: Yes    Attends Engineer, structural: More than 4 times per year    Marital Status: Married      Current Outpatient Medications:    famotidine  (ZANTAC 360) 10 MG tablet, Take 1 tablet (10 mg total) by mouth 2 (two) times daily., Disp: 60 tablet, Rfl: 11   Physical Exam:   BP (!) 144/94   Pulse 73   SpO2 94%   Pertinent Findings  CN II-XII intact Bilateral EAC clear and TM intact with well pneumatized middle ear spaces Anterior rhinoscopy: Septum midline; bilateral inferior turbinates with moderate hypertrophy  No obvious neck masses/lymphadenopathy/thyromegaly No respiratory distress or stridor  Seprately Identifiable Procedures:  None  Impression & Plans:  Candice Newton is a 58 y.o. female with the following   Eustachian tube dysfunction-  58 year old female seen in our office for follow-up evaluation of ear fullness.  She does have symptoms bilateral left is much greater than the right.  She has tried numerous treatments including Flonase, reflux Gourmet, Zantac.  None of the treatment over the last 78 months has improved her symptoms.  Her original audiological evaluation showed bilateral type a tympanometry.  Additionally today the patient notes she has had some consistent left-sided sinus pressure and wonders if some of this is contributing to the fullness she is feeling in her left ear.  I do think it is reasonable to treat for chronic sinus infection, she is allergic to penicillin so she was placed on doxycycline .  I would also like her to repeat her audiogram to assure no significant changes.  If she continues to have symptoms and has a negative audiogram I do not think it is unreasonable to obtain CT  sinuses given the persistent fullness, pressure and pain in left maxillary sinus.  I also discussed the likelihood of symptomatic improvement with tympanostomy tubes or balloon dilation, given the fact she has had normal audiogram I feel less optimistic that she will have significant improvement.  We will discuss a treatment plan once audiogram is available.  The patient verbalized understanding and agreement to today's plan   - f/u phone call discussion after audiological evaluation   Thank you for allowing me the opportunity to care for your patient. Please do not hesitate to contact me should you have any other questions.  Sincerely, Chyrl Cohen PA-C [facility] Phone: [phone] Fax: [phone]  10/24/2023, 8:37 AM

## 2023-12-16 ENCOUNTER — Telehealth: Payer: Self-pay | Admitting: *Deleted

## 2023-12-16 NOTE — Telephone Encounter (Signed)
 LVM informing pt that we need to reschedule her appt on 11/17 due to the provider not being in the office that day.  If she calls back please assist in getting this scheduled.

## 2023-12-28 ENCOUNTER — Telehealth: Payer: Self-pay | Admitting: *Deleted

## 2023-12-28 NOTE — Telephone Encounter (Signed)
 LVM for pt to call office to reschedule appt scheduled on 11/17 due to provider being out of office.  Please assist in rescheduling when she calls back.

## 2024-01-23 ENCOUNTER — Other Ambulatory Visit: Payer: 59

## 2024-01-23 ENCOUNTER — Other Ambulatory Visit: Payer: Self-pay

## 2024-01-23 DIAGNOSIS — Z130 Encounter for screening for diseases of the blood and blood-forming organs and certain disorders involving the immune mechanism: Secondary | ICD-10-CM

## 2024-01-23 DIAGNOSIS — E559 Vitamin D deficiency, unspecified: Secondary | ICD-10-CM

## 2024-01-24 ENCOUNTER — Other Ambulatory Visit

## 2024-01-24 DIAGNOSIS — Z130 Encounter for screening for diseases of the blood and blood-forming organs and certain disorders involving the immune mechanism: Secondary | ICD-10-CM

## 2024-01-24 DIAGNOSIS — E559 Vitamin D deficiency, unspecified: Secondary | ICD-10-CM

## 2024-01-25 ENCOUNTER — Encounter

## 2024-01-25 ENCOUNTER — Ambulatory Visit (INDEPENDENT_AMBULATORY_CARE_PROVIDER_SITE_OTHER)

## 2024-01-25 VITALS — BP 123/79 | HR 71 | Temp 97.8°F | Ht 66.0 in | Wt 165.1 lb

## 2024-01-25 DIAGNOSIS — E782 Mixed hyperlipidemia: Secondary | ICD-10-CM

## 2024-01-25 DIAGNOSIS — Z0000 Encounter for general adult medical examination without abnormal findings: Secondary | ICD-10-CM | POA: Diagnosis not present

## 2024-01-25 LAB — LIPID PANEL
Chol/HDL Ratio: 5.4 ratio — ABNORMAL HIGH (ref 0.0–4.4)
Cholesterol, Total: 305 mg/dL — ABNORMAL HIGH (ref 100–199)
HDL: 57 mg/dL (ref >39)
LDL Chol Calc (NIH): 228 mg/dL — ABNORMAL HIGH (ref 0–99)
Triglycerides: 115 mg/dL (ref 0–149)
VLDL Cholesterol Cal: 20 mg/dL (ref 5–40)

## 2024-01-25 LAB — CBC WITH DIFFERENTIAL/PLATELET
Basophils Absolute: 0.1 x10E3/uL (ref 0.0–0.2)
Basos: 1 %
EOS (ABSOLUTE): 0.1 x10E3/uL (ref 0.0–0.4)
Eos: 2 %
Hematocrit: 44.3 % (ref 34.0–46.6)
Hemoglobin: 14.6 g/dL (ref 11.1–15.9)
Immature Grans (Abs): 0 x10E3/uL (ref 0.0–0.1)
Immature Granulocytes: 0 %
Lymphocytes Absolute: 1.5 x10E3/uL (ref 0.7–3.1)
Lymphs: 37 %
MCH: 28.7 pg (ref 26.6–33.0)
MCHC: 33 g/dL (ref 31.5–35.7)
MCV: 87 fL (ref 79–97)
Monocytes Absolute: 0.4 x10E3/uL (ref 0.1–0.9)
Monocytes: 10 %
Neutrophils Absolute: 2 x10E3/uL (ref 1.4–7.0)
Neutrophils: 50 %
Platelets: 195 x10E3/uL (ref 150–450)
RBC: 5.09 x10E6/uL (ref 3.77–5.28)
RDW: 13.4 % (ref 11.7–15.4)
WBC: 4.1 x10E3/uL (ref 3.4–10.8)

## 2024-01-25 LAB — VITAMIN D 25 HYDROXY (VIT D DEFICIENCY, FRACTURES): Vit D, 25-Hydroxy: 55.7 ng/mL (ref 30.0–100.0)

## 2024-01-25 LAB — COMPREHENSIVE METABOLIC PANEL WITH GFR
ALT: 15 IU/L (ref 0–32)
AST: 15 IU/L (ref 0–40)
Albumin: 4.4 g/dL (ref 3.8–4.9)
Alkaline Phosphatase: 67 IU/L (ref 49–135)
BUN/Creatinine Ratio: 19 (ref 9–23)
BUN: 14 mg/dL (ref 6–24)
Bilirubin Total: 0.4 mg/dL (ref 0.0–1.2)
CO2: 25 mmol/L (ref 20–29)
Calcium: 9.6 mg/dL (ref 8.7–10.2)
Chloride: 103 mmol/L (ref 96–106)
Creatinine, Ser: 0.73 mg/dL (ref 0.57–1.00)
Globulin, Total: 2.3 g/dL (ref 1.5–4.5)
Glucose: 102 mg/dL — ABNORMAL HIGH (ref 70–99)
Potassium: 4.8 mmol/L (ref 3.5–5.2)
Sodium: 141 mmol/L (ref 134–144)
Total Protein: 6.7 g/dL (ref 6.0–8.5)
eGFR: 96 mL/min/1.73 (ref >59)

## 2024-01-25 LAB — HEMOGLOBIN A1C
Est. average glucose Bld gHb Est-mCnc: 114 mg/dL
Hgb A1c MFr Bld: 5.6 % (ref 4.8–5.6)

## 2024-01-25 LAB — TSH: TSH: 1.63 u[IU]/mL (ref 0.450–4.500)

## 2024-01-25 NOTE — Patient Instructions (Signed)
 VISIT SUMMARY: You came in for your annual physical exam. We discussed your ear pressure issues, glucose regulation, and cholesterol levels. Your lab results showed slightly elevated fasting glucose but improved A1c levels. We also talked about alternative treatments for your high cholesterol.  YOUR PLAN: ADULT WELLNESS VISIT: Routine wellness visit with normal labs except for slightly elevated fasting glucose. Improved A1c indicates better glucose control. -We ordered a coronary artery calcium  scan. -We will update your Pap smear records once available.  HYPERLIPIDEMIA: Elevated LDL and total cholesterol likely due to genetic factors. Previous statin therapy not tolerated. -We ordered a coronary artery calcium  scan. -We discussed Zetia and Repatha as alternative treatments for your high cholesterol.  IMPAIRED HEARING, RIGHT EAR: Chronic hearing impairment with pressure sensation, unresponsive to previous ENT interventions. -Continue using Flonase nasal spray as needed, especially during travel.  If you have any problems before your next visit feel free to message me via MyChart (minor issues or questions) or call the office, otherwise you may reach out to schedule an office visit.  Thank you! Saddie Sacks, PA-C

## 2024-01-25 NOTE — Assessment & Plan Note (Signed)
 Last lipid panel: total cholesterol: 305, LDL 228, HDL 57, Trig 115. The 10-year ASCVD risk score (Arnett DK, et al., 2019) is: 3.2%  Patient previously tried a statin but had to stop due to side effects. Open to CAC scan. Order placed today. Discussed non-statin options including Zetia and Repatha. Patient will await CAC scan results and we will revisit discussion based on results.

## 2024-01-25 NOTE — Assessment & Plan Note (Signed)
 Routine wellness visit with normal labs except for slightly elevated fasting glucose. Improved A1c indicates better glucose control. Pap smear status pending ( will request records from [facility])  - Colonoscopy and mammogram UTD  - Flu vaccine done.  - Shingles done.  - Declined PCV20 - Ordered coronary artery calcium  scan. - Will update Pap smear records.

## 2024-01-25 NOTE — Progress Notes (Signed)
 Complete physical exam  Patient: Candice Newton   DOB: 23-Jul-1965   58 y.o. Female  MRN: [id number]  Subjective:    Chief Complaint  Patient presents with   Annual Exam    History of Present Illness   Candice Newton is a 58 year old female who presents for an annual physical exam.   Hyperlipidemia - Increase in cholesterol levels, with total cholesterol exceeding 300 mg/dL - Elevated LDL cholesterol - Attribution of elevated cholesterol to genetic factors and recent dietary choices prior to testing - Previous adverse effects from statins, including headaches - Hesitancy to restart statin therapy  Lifestyle and general health - Balanced diet with emphasis on greens - Regular exercise - Good sleep quality - Regular bowel movements          Most recent fall risk assessment:    01/25/2024    4:02 PM  Fall Risk   Falls in the past year? 0  Follow up Falls evaluation completed     Most recent depression screenings:    01/25/2024    4:02 PM 01/24/2023    8:53 AM  PHQ 2/9 Scores  PHQ - 2 Score 0 0  PHQ- 9 Score 0 0      Data saved with a previous flowsheet row definition    Vision:Within last year and Dental: No current dental problems and Receives regular dental care    Patient Care Team: Gayle Saddie JULIANNA DEVONNA as PCP - General (Physician Assistant) Zan Factor, DPM as Consulting Physician (Podiatry)   Outpatient Medications Prior to Visit  Medication Sig   famotidine  (ZANTAC 360) 10 MG tablet Take 1 tablet (10 mg total) by mouth 2 (two) times daily.   No facility-administered medications prior to visit.    ROS   Per HPI      Objective:     BP 123/79   Pulse 71   Temp 97.8 F (36.6 C) (Oral)   Ht 5' 6 (1.676 m)   Wt 165 lb 1.9 oz (74.9 kg)   SpO2 97%   BMI 26.65 kg/m    Physical Exam Constitutional:      General: She is not in acute distress.    Appearance: Normal appearance.  Eyes:     Pupils: Pupils are equal, round, and reactive  to light.  Cardiovascular:     Rate and Rhythm: Normal rate and regular rhythm.     Heart sounds: Normal heart sounds. No murmur heard.    No friction rub. No gallop.  Pulmonary:     Effort: Pulmonary effort is normal. No respiratory distress.     Breath sounds: Normal breath sounds.  Abdominal:     General: Bowel sounds are normal.  Musculoskeletal:        General: No swelling.     Cervical back: Neck supple.  Lymphadenopathy:     Cervical: No cervical adenopathy.  Skin:    General: Skin is warm and dry.  Neurological:     General: No focal deficit present.     Mental Status: She is alert.  Psychiatric:        Mood and Affect: Mood normal.        Behavior: Behavior normal.        Thought Content: Thought content normal.       No results found for any visits on 01/25/24. Last CBC Lab Results  Component Value Date   WBC 4.1 01/24/2024   HGB 14.6 01/24/2024   HCT 44.3  01/24/2024   MCV 87 01/24/2024   MCH 28.7 01/24/2024   RDW 13.4 01/24/2024   PLT 195 01/24/2024   Last metabolic panel Lab Results  Component Value Date   GLUCOSE 102 (H) 01/24/2024   NA 141 01/24/2024   K 4.8 01/24/2024   CL 103 01/24/2024   CO2 25 01/24/2024   BUN 14 01/24/2024   CREATININE 0.73 01/24/2024   EGFR 96 01/24/2024   CALCIUM  9.6 01/24/2024   PROT 6.7 01/24/2024   ALBUMIN 4.4 01/24/2024   LABGLOB 2.3 01/24/2024   AGRATIO 1.9 01/12/2022   BILITOT 0.4 01/24/2024   ALKPHOS 67 01/24/2024   AST 15 01/24/2024   ALT 15 01/24/2024   Last lipids Lab Results  Component Value Date   CHOL 305 (H) 01/24/2024   HDL 57 01/24/2024   LDLCALC 228 (H) 01/24/2024   TRIG 115 01/24/2024   CHOLHDL 5.4 (H) 01/24/2024   Last hemoglobin A1c Lab Results  Component Value Date   HGBA1C 5.6 01/24/2024   Last thyroid functions Lab Results  Component Value Date   TSH 1.630 01/24/2024   FREET4 1.15 05/16/2018   Last vitamin D  Lab Results  Component Value Date   VD25OH 55.7 01/24/2024         Assessment & Plan:    Routine Health Maintenance and Physical Exam  Health Maintenance  Topic Date Due   Hepatitis B Vaccine (1 of 3 - 19+ 3-dose series) Never done   Pap with HPV screening  09/11/2023   COVID-19 Vaccine (1 - 2025-26 season) Never done   Flu Shot  06/12/2024*   Pneumococcal Vaccine for age over 52 (1 of 1 - PCV) 01/24/2025*   Breast Cancer Screening  05/01/2025   Colon Cancer Screening  12/16/2027   DTaP/Tdap/Td vaccine (3 - Td or Tdap) 09/04/2028   Hepatitis C Screening  Completed   HIV Screening  Completed   Zoster (Shingles) Vaccine  Completed   HPV Vaccine  Aged Out   Meningitis B Vaccine  Aged Out  *Topic was postponed. The date shown is not the original due date.    Discussed health benefits of physical activity, and encouraged her to engage in regular exercise appropriate for her age and condition.  Mixed hyperlipidemia Assessment & Plan: Last lipid panel: total cholesterol: 305, LDL 228, HDL 57, Trig 115. The 10-year ASCVD risk score (Arnett DK, et al., 2019) is: 3.2%  Patient previously tried a statin but had to stop due to side effects. Open to CAC scan. Order placed today. Discussed non-statin options including Zetia and Repatha. Patient will await CAC scan results and we will revisit discussion based on results.   Orders: -     CT CARDIAC SCORING (SELF PAY ONLY); Future  Wellness examination Assessment & Plan: Routine wellness visit with normal labs except for slightly elevated fasting glucose. Improved A1c indicates better glucose control. Pap smear status pending ( will request records from [facility])  - Colonoscopy and mammogram UTD  - Flu vaccine done.  - Shingles done.  - Declined PCV20 - Ordered coronary artery calcium  scan. - Will update Pap smear records.    Return in about 1 year (around 01/24/2025) for Physical.     Saddie JULIANNA Sacks, PA-C

## 2024-01-30 ENCOUNTER — Encounter: Payer: 59 | Admitting: Family Medicine

## 2024-01-30 ENCOUNTER — Encounter

## 2024-02-14 ENCOUNTER — Ambulatory Visit (HOSPITAL_BASED_OUTPATIENT_CLINIC_OR_DEPARTMENT_OTHER): Admission: RE | Admit: 2024-02-14 | Discharge: 2024-02-14 | Disposition: A | Payer: Self-pay | Source: Ambulatory Visit

## 2024-02-14 DIAGNOSIS — E782 Mixed hyperlipidemia: Secondary | ICD-10-CM | POA: Insufficient documentation

## 2024-02-20 ENCOUNTER — Ambulatory Visit: Payer: Self-pay

## 2024-03-18 ENCOUNTER — Telehealth: Admitting: Physician Assistant

## 2024-03-18 DIAGNOSIS — H6503 Acute serous otitis media, bilateral: Secondary | ICD-10-CM

## 2024-03-19 MED ORDER — DOXYCYCLINE HYCLATE 100 MG PO TABS: 100.0000 mg | ORAL_TABLET | Freq: Two times a day (BID) | ORAL | 0 refills | Status: AC

## 2024-03-19 NOTE — Progress Notes (Signed)

## 2025-01-22 ENCOUNTER — Other Ambulatory Visit

## 2025-01-29 ENCOUNTER — Encounter
